# Patient Record
Sex: Female | Born: 1945 | Race: White | Hispanic: No | Marital: Single | Smoking: Never smoker
Health system: Southern US, Community
[De-identification: ages and names within clinical notes are randomized; demographics above are authoritative.]

## PROBLEM LIST (undated history)

## (undated) DIAGNOSIS — R110 Nausea: Secondary | ICD-10-CM

## (undated) DIAGNOSIS — I499 Cardiac arrhythmia, unspecified: Secondary | ICD-10-CM

## (undated) DIAGNOSIS — M8580 Other specified disorders of bone density and structure, unspecified site: Secondary | ICD-10-CM

## (undated) DIAGNOSIS — E785 Hyperlipidemia, unspecified: Secondary | ICD-10-CM

## (undated) DIAGNOSIS — I5189 Other ill-defined heart diseases: Secondary | ICD-10-CM

## (undated) DIAGNOSIS — K8020 Calculus of gallbladder without cholecystitis without obstruction: Secondary | ICD-10-CM

## (undated) DIAGNOSIS — M25569 Pain in unspecified knee: Secondary | ICD-10-CM

## (undated) DIAGNOSIS — G8929 Other chronic pain: Secondary | ICD-10-CM

## (undated) DIAGNOSIS — K5790 Diverticulosis of intestine, part unspecified, without perforation or abscess without bleeding: Secondary | ICD-10-CM

## (undated) DIAGNOSIS — K224 Dyskinesia of esophagus: Secondary | ICD-10-CM

## (undated) DIAGNOSIS — K602 Anal fissure, unspecified: Secondary | ICD-10-CM

## (undated) DIAGNOSIS — E039 Hypothyroidism, unspecified: Secondary | ICD-10-CM

## (undated) DIAGNOSIS — M810 Age-related osteoporosis without current pathological fracture: Secondary | ICD-10-CM

## (undated) DIAGNOSIS — R12 Heartburn: Secondary | ICD-10-CM

## (undated) DIAGNOSIS — M1990 Unspecified osteoarthritis, unspecified site: Secondary | ICD-10-CM

## (undated) DIAGNOSIS — H269 Unspecified cataract: Secondary | ICD-10-CM

## (undated) DIAGNOSIS — R002 Palpitations: Secondary | ICD-10-CM

## (undated) DIAGNOSIS — K635 Polyp of colon: Secondary | ICD-10-CM

## (undated) DIAGNOSIS — K219 Gastro-esophageal reflux disease without esophagitis: Secondary | ICD-10-CM

## (undated) HISTORY — DX: Other specified disorders of bone density and structure, unspecified site: M85.80

## (undated) HISTORY — DX: Diverticulosis of intestine, part unspecified, without perforation or abscess without bleeding: K57.90

## (undated) HISTORY — DX: Other chronic pain: G89.29

## (undated) HISTORY — DX: Hypothyroidism, unspecified: E03.9

## (undated) HISTORY — DX: Other ill-defined heart diseases: I51.89

## (undated) HISTORY — DX: Age-related osteoporosis without current pathological fracture: M81.0

## (undated) HISTORY — DX: Hyperlipidemia, unspecified: E78.5

## (undated) HISTORY — PX: HEEL SPUR SURGERY: SHX665

## (undated) HISTORY — PX: CHOLECYSTECTOMY: SHX55

## (undated) HISTORY — PX: ANAL FISSURE REPAIR: SHX2312

## (undated) HISTORY — DX: Gastro-esophageal reflux disease without esophagitis: K21.9

## (undated) HISTORY — DX: Anal fissure, unspecified: K60.2

## (undated) HISTORY — DX: Cardiac arrhythmia, unspecified: I49.9

## (undated) HISTORY — DX: Polyp of colon: K63.5

## (undated) HISTORY — DX: Nausea: R11.0

## (undated) HISTORY — DX: Unspecified cataract: H26.9

## (undated) HISTORY — DX: Unspecified osteoarthritis, unspecified site: M19.90

## (undated) HISTORY — DX: Pain in unspecified knee: M25.569

## (undated) HISTORY — DX: Heartburn: R12

## (undated) HISTORY — DX: Calculus of gallbladder without cholecystitis without obstruction: K80.20

## (undated) HISTORY — DX: Palpitations: R00.2

## (undated) HISTORY — PX: LIPOSUCTION: SHX10

## (undated) HISTORY — DX: Dyskinesia of esophagus: K22.4

## (undated) SURGERY — ESOPHAGOGASTRODUODENOSCOPY (EGD) WITH PROPOFOL
Anesthesia: Monitor Anesthesia Care

---

## 1969-01-03 HISTORY — PX: CHOLECYSTECTOMY: SHX55

## 1972-05-05 HISTORY — PX: THYROIDECTOMY: SHX17

## 1978-05-05 HISTORY — PX: ABDOMINAL HYSTERECTOMY: SHX81

## 2001-06-16 ENCOUNTER — Encounter: Payer: Self-pay | Admitting: Family Medicine

## 2001-06-16 ENCOUNTER — Encounter: Admission: RE | Admit: 2001-06-16 | Discharge: 2001-06-16 | Payer: Self-pay | Admitting: Family Medicine

## 2004-08-06 ENCOUNTER — Encounter: Admission: RE | Admit: 2004-08-06 | Discharge: 2004-08-06 | Payer: Self-pay | Admitting: General Surgery

## 2004-08-07 ENCOUNTER — Ambulatory Visit (HOSPITAL_BASED_OUTPATIENT_CLINIC_OR_DEPARTMENT_OTHER): Admission: RE | Admit: 2004-08-07 | Discharge: 2004-08-07 | Payer: Self-pay | Admitting: General Surgery

## 2004-08-07 ENCOUNTER — Ambulatory Visit (HOSPITAL_COMMUNITY): Admission: RE | Admit: 2004-08-07 | Discharge: 2004-08-07 | Payer: Self-pay | Admitting: General Surgery

## 2005-04-10 ENCOUNTER — Ambulatory Visit: Payer: Self-pay | Admitting: Internal Medicine

## 2005-05-21 ENCOUNTER — Encounter (INDEPENDENT_AMBULATORY_CARE_PROVIDER_SITE_OTHER): Payer: Self-pay | Admitting: *Deleted

## 2005-05-21 ENCOUNTER — Ambulatory Visit: Payer: Self-pay | Admitting: Internal Medicine

## 2006-02-11 ENCOUNTER — Ambulatory Visit: Payer: Self-pay | Admitting: Internal Medicine

## 2006-03-24 ENCOUNTER — Ambulatory Visit: Payer: Self-pay | Admitting: Internal Medicine

## 2006-06-10 ENCOUNTER — Ambulatory Visit: Payer: Self-pay | Admitting: Internal Medicine

## 2006-07-14 ENCOUNTER — Ambulatory Visit: Payer: Self-pay | Admitting: Internal Medicine

## 2007-03-22 ENCOUNTER — Encounter: Admission: RE | Admit: 2007-03-22 | Discharge: 2007-03-22 | Payer: Self-pay | Admitting: *Deleted

## 2007-06-01 ENCOUNTER — Ambulatory Visit: Payer: Self-pay | Admitting: Internal Medicine

## 2007-06-02 ENCOUNTER — Encounter: Payer: Self-pay | Admitting: Internal Medicine

## 2007-06-02 ENCOUNTER — Ambulatory Visit: Payer: Self-pay | Admitting: Internal Medicine

## 2007-06-07 ENCOUNTER — Ambulatory Visit: Payer: Self-pay | Admitting: Gastroenterology

## 2007-06-07 ENCOUNTER — Ambulatory Visit: Payer: Self-pay | Admitting: Internal Medicine

## 2007-06-07 LAB — CONVERTED CEMR LAB
Basophils Absolute: 0 10*3/uL (ref 0.0–0.1)
Basophils Relative: 0.2 % (ref 0.0–1.0)
Eosinophils Absolute: 0 10*3/uL (ref 0.0–0.6)
Eosinophils Relative: 0.5 % (ref 0.0–5.0)
HCT: 41 % (ref 36.0–46.0)
Hemoglobin: 13.5 g/dL (ref 12.0–15.0)
Lymphocytes Relative: 28.7 % (ref 12.0–46.0)
MCHC: 32.8 g/dL (ref 30.0–36.0)
MCV: 86.1 fL (ref 78.0–100.0)
Monocytes Absolute: 0.3 10*3/uL (ref 0.2–0.7)
Monocytes Relative: 7.9 % (ref 3.0–11.0)
Neutro Abs: 2.8 10*3/uL (ref 1.4–7.7)
Neutrophils Relative %: 62.7 % (ref 43.0–77.0)
Platelets: 273 10*3/uL (ref 150–400)
RBC: 4.77 M/uL (ref 3.87–5.11)
RDW: 12.2 % (ref 11.5–14.6)
WBC: 4.3 10*3/uL — ABNORMAL LOW (ref 4.5–10.5)

## 2009-03-15 DIAGNOSIS — K5730 Diverticulosis of large intestine without perforation or abscess without bleeding: Secondary | ICD-10-CM | POA: Insufficient documentation

## 2009-03-15 DIAGNOSIS — Z860100 Personal history of colon polyps, unspecified: Secondary | ICD-10-CM | POA: Insufficient documentation

## 2009-03-15 DIAGNOSIS — K219 Gastro-esophageal reflux disease without esophagitis: Secondary | ICD-10-CM | POA: Insufficient documentation

## 2009-03-15 DIAGNOSIS — K589 Irritable bowel syndrome without diarrhea: Secondary | ICD-10-CM | POA: Insufficient documentation

## 2009-03-15 DIAGNOSIS — R1310 Dysphagia, unspecified: Secondary | ICD-10-CM | POA: Insufficient documentation

## 2009-03-21 ENCOUNTER — Ambulatory Visit: Payer: Self-pay | Admitting: Internal Medicine

## 2009-03-23 ENCOUNTER — Ambulatory Visit (HOSPITAL_COMMUNITY): Admission: RE | Admit: 2009-03-23 | Discharge: 2009-03-23 | Payer: Self-pay | Admitting: Internal Medicine

## 2009-05-11 ENCOUNTER — Ambulatory Visit: Payer: Self-pay | Admitting: Internal Medicine

## 2010-05-15 ENCOUNTER — Encounter: Payer: Self-pay | Admitting: Internal Medicine

## 2010-06-04 NOTE — Assessment & Plan Note (Signed)
Summary: f.u ..em    History of Present Illness Visit Type: follow up Primary GI MD: Lina Sar MD Primary Provider: Herb Grays, MD Requesting Provider: n/a Chief Complaint: Follow up for dysphagia and diarrhea. Pt states that she is much better and denies any GI complaints  History of Present Illness:   This is a 65 year old white female with gastroesophageal reflux and secondary esophageal dysmotility. She is much improved on Prilosec 20 mg twice a day. Her dysphagia has resolved. A barium swallow done in November 2002  showed a disruption of the primary wave and contractions consistent with a nonspecific esophageal motility disorder. An upper endoscopy done in January 2009 for chest pain was normal; Her last colonoscopy in January 2007 showed mild diverticulosis and a hyperplastic polyp. Her recall colonoscopy will be due in January 2014. Medical history is positive for cholecystectomy in 1978, liposuction, thyroidectomy and history of anal fissure.   GI Review of Systems      Denies abdominal pain, acid reflux, belching, bloating, chest pain, dysphagia with liquids, dysphagia with solids, heartburn, loss of appetite, nausea, vomiting, vomiting blood, weight loss, and  weight gain.        Denies anal fissure, black tarry stools, change in bowel habit, constipation, diarrhea, diverticulosis, fecal incontinence, heme positive stool, hemorrhoids, irritable bowel syndrome, jaundice, light color stool, liver problems, rectal bleeding, and  rectal pain.    Current Medications (verified): 1)  Synthroid 75 Mcg Tabs (Levothyroxine Sodium) .... Once Daily 2)  Librax 2.5-5 Mg Caps (Clidinium-Chlordiazepoxide) .... As Needed 3)  Prilosec 20 Mg Cpdr (Omeprazole) .... Take 1 Tablet By Mouth Two Times A Day 30 Minutes Prior To A Meal.  Allergies (verified): No Known Drug Allergies  Past History:  Past Medical History: Reviewed history from 03/15/2009 and no changes required. Current Problems:   DYSPHAGIA UNSPECIFIED (ICD-787.20) COLONIC POLYPS, HYPERPLASTIC, HX OF (ICD-V12.72) IRRITABLE BOWEL SYNDROME (ICD-564.1) DIVERTICULOSIS, COLON (ICD-562.10) GERD (ICD-530.81)    Past Surgical History: Reviewed history from 03/15/2009 and no changes required. Cholecystectomy Hysterectomy Anal Fissure Repair Liposuction Thyroidectomy  Family History: Reviewed history from 03/15/2009 and no changes required. Family History of Colon Polyps: Family History of Breast Cancer: Sister No FH of Colon Cancer: Family History of Heart Disease: Father  Social History: Reviewed history from 03/21/2009 and no changes required. Occupation: CNA Alcohol Use - no Illicit Drug Use - no Patient has never smoked.  Daily Caffeine Use  Review of Systems  The patient denies allergy/sinus, anemia, anxiety-new, arthritis/joint pain, back pain, blood in urine, breast changes/lumps, change in vision, confusion, cough, coughing up blood, depression-new, fainting, fatigue, fever, headaches-new, hearing problems, heart murmur, heart rhythm changes, itching, menstrual pain, muscle pains/cramps, night sweats, nosebleeds, pregnancy symptoms, shortness of breath, skin rash, sleeping problems, sore throat, swelling of feet/legs, swollen lymph glands, thirst - excessive , urination - excessive , urination changes/pain, urine leakage, vision changes, and voice change.         Pertinent positive and negative review of systems were noted in the above HPI. All other ROS was otherwise negative.   Vital Signs:  Patient profile:   65 year old female Height:      63 inches Weight:      152 pounds BMI:     27.02 BSA:     1.72 Pulse rate:   76 / minute Pulse rhythm:   regular BP sitting:   104 / 72  (left arm) Cuff size:   regular  Vitals Entered By: Ok Anis CMA (May 11, 2009 1:58 PM)   Impression & Recommendations:  Problem # 1:  DYSPHAGIA UNSPECIFIED (ICD-787.20) Patient has had complete resolution  of dysphagia secondary to esophageal dysmotility. The symptoms improved on an increased dose of a proton pump inhibitor; Prilosec 20 mg twice a day. She now has only occasional episodes of dysphagia. She will continue on the current regimen.  Problem # 2:  IRRITABLE BOWEL SYNDROME (ICD-564.1) Patient has IBS with predominant diarrhea under good control with Librax p.r.n. Her last colonoscopy was completed in 2007. A recall colonoscopy will be due in January 2014.  Problem # 3:  COLONIC POLYPS, HYPERPLASTIC, HX OF (ICD-V12.72) Patient had a hyperplastic polyp found on a colonoscopy done in January 2007. Her recall colonoscopy will be due in January 2014.  Patient Instructions: 1)  Prilosec 20 mg p.o. b.i.d. 2)  Antireflux measures. 3)  Librax one p.o. p.r.n. irritable bowel syndrome/diarrhea. 4)  Recall colonoscopy January 2014. 5)  Copy sent to : Dr Collins Scotland

## 2010-06-06 NOTE — Letter (Signed)
Summary: Colonoscopy Date Change Letter  [facility]  [address] [place], [place] [address]   Phone: [phone]  Fax: [phone]      May 15, 2010 MRN: [id number]   Gloria Atkins [address] [place], [place]  [address]   Dear Ms. Batten,   Previously you were recommended to have a repeat colonoscopy around this time. Your chart was recently reviewed by Dr. Lina Sar of [facility]. Follow up colonoscopy is now recommended in January 2014. This revised recommendation is based on current, nationally recognized guidelines for colorectal cancer screening and polyp surveillance. These guidelines are endorsed by the American Cancer Society, [employer] on Colorectal Cancer as well as numerous other major medical organizations.  Please understand that our recommendation assumes that you do not have any new symptoms such as bleeding, a change in bowel habits, anemia, or significant abdominal discomfort. If you do have any concerning GI symptoms or want to discuss the guideline recommendations, please call to arrange an office visit at your earliest convenience. Otherwise we will keep you in our reminder system and contact you 1-2 months prior to the date listed above to schedule your next colonoscopy.  Thank you,  Hedwig Morton. Juanda Chance, M.D.  [facility] [phone]

## 2010-08-07 ENCOUNTER — Encounter: Payer: Self-pay | Admitting: Cardiology

## 2010-08-07 DIAGNOSIS — G8929 Other chronic pain: Secondary | ICD-10-CM | POA: Insufficient documentation

## 2010-08-07 DIAGNOSIS — I5189 Other ill-defined heart diseases: Secondary | ICD-10-CM | POA: Insufficient documentation

## 2010-08-07 DIAGNOSIS — R0602 Shortness of breath: Secondary | ICD-10-CM | POA: Insufficient documentation

## 2010-08-07 DIAGNOSIS — M8580 Other specified disorders of bone density and structure, unspecified site: Secondary | ICD-10-CM | POA: Insufficient documentation

## 2010-08-08 ENCOUNTER — Ambulatory Visit (INDEPENDENT_AMBULATORY_CARE_PROVIDER_SITE_OTHER): Payer: 59 | Admitting: Cardiology

## 2010-08-08 ENCOUNTER — Encounter: Payer: Self-pay | Admitting: Cardiology

## 2010-08-08 VITALS — BP 132/78 | HR 69 | Ht 64.0 in | Wt 150.6 lb

## 2010-08-08 DIAGNOSIS — R002 Palpitations: Secondary | ICD-10-CM

## 2010-08-08 DIAGNOSIS — R079 Chest pain, unspecified: Secondary | ICD-10-CM | POA: Insufficient documentation

## 2010-08-08 NOTE — Assessment & Plan Note (Signed)
Need to rule out primary arrhythmia. We will have the patient wear an event monitor and followup again in one month. I have recommended that she avoid caffeine.

## 2010-08-08 NOTE — Progress Notes (Signed)
HPI Gloria Atkins is seen today at the request of Dr. Collins Scotland for evaluation of palpitations.she also has a history of chest pain. She states she has been having palpitations off and on for the past several months. She describes this as her heart beating fast. It occurs mostly at night. It may occur up to 3 days a week. It typically lasts a few minutes and then resolves. She has no associated dizziness or syncope. She also describes  substernal chest pain that she feels is more related to acid reflux. She states that it is central pressure-like sensation. She did have a stress echo here in 2010 which was normal. She had GI evaluation was told that she had acid reflux. She has not been taking any regular medication for this. No Known Allergies  Current Outpatient Prescriptions on File Prior to Visit  Medication Sig Dispense Refill  . aspirin EC 81 MG EC tablet Take 81 mg by mouth daily.        . Calcium Citrate-Vitamin D (CITRACAL + D PO) Take by mouth 2 (two) times daily.        . Cholecalciferol (VITAMIN D PO) Take by mouth daily.        Marland Kitchen HYDROcodone-ibuprofen (VICOPROFEN) 7.5-200 MG per tablet Take 1 tablet by mouth every 8 (eight) hours as needed.        . hydrOXYzine (ATARAX) 25 MG tablet Take 25 mg by mouth as needed.        Marland Kitchen levothyroxine (SYNTHROID, LEVOTHROID) 88 MCG tablet Take 88 mcg by mouth daily.        . Magnesium 250 MG TABS Take by mouth daily as needed.        Marland Kitchen omeprazole (PRILOSEC) 20 MG capsule Take 20 mg by mouth 2 (two) times daily.          Past Medical History  Diagnosis Date  . Osteopenia   . Chronic knee pain   . Arrhythmia     SOMETIMES HER HEART WILL BEAT REALLY FAST FOR NO REASON  . SOB (shortness of breath)   . Heartburn   . Nausea   . Diastolic dysfunction   . GERD (gastroesophageal reflux disease)     Past Surgical History  Procedure Date  . Gallbladder surgery   . Colonoscopy   . Thyroidectomy   . Heel spur surgery   . Abdominal hysterectomy      Family History  Problem Relation Age of Onset  . Coronary artery disease Father   . Heart attack Father   . Hypertension    . Breast cancer Sister   . Diabetes      History   Social History  . Marital Status: Divorced    Spouse Name: N/A    Number of Children: 2  . Years of Education: N/A   Occupational History  . CNA     [employer]   Social History Main Topics  . Smoking status: Never Smoker   . Smokeless tobacco: Not on file  . Alcohol Use: No  . Drug Use: No  . Sexually Active:    Other Topics Concern  . Not on file   Social History Narrative  . No narrative on file    ROS The patient denies any heat or cold intolerance.  No weight gain or weight loss.  The patient denies headaches or blurry vision.  There is no cough or sputum production.  The patient denies dizziness.  There is no hematuria or hematochezia.  The  patient denies any muscle aches or arthritis.  The patient denies any rash.  The patient denies frequent falling or instability.  There is no history of depression or anxiety.  All other systems were reviewed and are negative.  PHYSICAL EXAM BP 132/78  Pulse 69  Ht 5\' 4"  (1.626 m)  Wt 150 lb 9.6 oz (68.312 kg)  BMI 25.85 kg/m2 The patient is alert and oriented x 3.  The mood and affect are normal.  The skin is warm and dry.  Color is normal.  The HEENT exam reveals that the sclera are nonicteric.  The mucous membranes are moist.  The carotids are 2+ without bruits.  There is no thyromegaly.  There is no JVD.  The lungs are clear.  The chest wall is non tender.  The heart exam reveals a regular rate with a normal S1 and S2.  There are no murmurs, gallops, or rubs.  The PMI is not displaced.   Abdominal exam reveals good bowel sounds.  There is no guarding or rebound.  There is no hepatosplenomegaly or tenderness.  There are no masses.  Exam of the legs reveal no clubbing, cyanosis, or edema.  The legs are without rashes.  The distal pulses are intact.   Cranial nerves II - XII are intact.  Motor and sensory functions are intact.  The gait is normal.  Laboratory data: ECG today demonstrates normal sinus rhythm with poor R-wave progression in the anterior precordium. This is unchanged compared to prior ECG in June of 2010. ASSESSMENT AND PLAN

## 2010-08-08 NOTE — Patient Instructions (Signed)
Take Omeprazole 20 mg daily for acid reflux.  We will have you wear an event monitor to look at your heart rhythm..  You should reduce your caffeine intake.  We will see you back in one month for office visit.

## 2010-08-08 NOTE — Assessment & Plan Note (Signed)
Patient symptoms are atypical. I recommended a trial of omeprazole 20 mg daily. If her symptoms resolve then we probably do not need to pursue any further evaluation. If she continues to have chest discomfort we can reschedule her for a stress echo and compare with 2010.

## 2010-09-04 ENCOUNTER — Encounter: Payer: Self-pay | Admitting: Cardiology

## 2010-09-10 ENCOUNTER — Ambulatory Visit: Payer: 59 | Admitting: Nurse Practitioner

## 2010-09-10 ENCOUNTER — Encounter: Payer: Self-pay | Admitting: Nurse Practitioner

## 2010-09-10 ENCOUNTER — Ambulatory Visit (INDEPENDENT_AMBULATORY_CARE_PROVIDER_SITE_OTHER): Payer: 59 | Admitting: Nurse Practitioner

## 2010-09-10 ENCOUNTER — Telehealth: Payer: Self-pay | Admitting: *Deleted

## 2010-09-10 VITALS — BP 120/70 | HR 76 | Wt 152.0 lb

## 2010-09-10 DIAGNOSIS — R002 Palpitations: Secondary | ICD-10-CM

## 2010-09-10 NOTE — Patient Instructions (Signed)
Continue to limit your caffeine. We will see you back as needed.

## 2010-09-10 NOTE — Progress Notes (Signed)
   Gloria Atkins Date of Birth: 1945-06-11   History of Present Illness: Gloria Atkins is seen back today for a one month visit. She is seen for Dr. [place]. She had palpitations. Her event monitor was normal. No arrhythmia was noted. She had a negative stress echo in 2010. Since she was last here, she has stopped almost all of her caffeine. Her palpitations have now stopped.   Current Outpatient Prescriptions on File Prior to Visit  Medication Sig Dispense Refill  . aspirin EC 81 MG EC tablet Take 81 mg by mouth daily.        . Calcium Citrate-Vitamin D (CITRACAL + D PO) Take by mouth 2 (two) times daily.        . Cholecalciferol (VITAMIN D PO) Take by mouth daily.        Marland Kitchen HYDROcodone-ibuprofen (VICOPROFEN) 7.5-200 MG per tablet Take 1 tablet by mouth every 8 (eight) hours as needed.        Marland Kitchen levothyroxine (SYNTHROID, LEVOTHROID) 88 MCG tablet Take 88 mcg by mouth daily.        . hydrOXYzine (ATARAX) 25 MG tablet Take 25 mg by mouth as needed.        . Magnesium 250 MG TABS Take by mouth daily as needed.        Marland Kitchen omeprazole (PRILOSEC) 20 MG capsule Take 20 mg by mouth 2 (two) times daily.          No Known Allergies  Past Medical History  Diagnosis Date  . Osteopenia   . Chronic knee pain   . Arrhythmia     SOMETIMES HER HEART WILL BEAT REALLY FAST FOR NO REASON  . SOB (shortness of breath)   . Heartburn   . Nausea   . Diastolic dysfunction   . GERD (gastroesophageal reflux disease)     Past Surgical History  Procedure Date  . Gallbladder surgery   . Colonoscopy   . Thyroidectomy   . Heel spur surgery   . Abdominal hysterectomy     History  Smoking status  . Never Smoker   Smokeless tobacco  . Not on file    History  Alcohol Use No    Family History  Problem Relation Age of Onset  . Coronary artery disease Father   . Heart attack Father   . Hypertension    . Breast cancer Sister   . Diabetes      Review of Systems: The review of systems is as above.  All  other systems were reviewed and are negative.  Physical Exam: BP 120/70  Pulse 76  Wt 152 lb (68.947 kg) Patient is very pleasant and in no acute distress. Skin is warm and dry. Color is normal.  HEENT is unremarkable. Normocephalic/atraumatic. PERRL. Sclera are nonicteric. Neck is supple. No masses. No JVD. Lungs are clear. Cardiac exam shows a regular rate and rhythm. Abdomen is soft. Extremities are without edema. Gait and ROM are intact. No gross neurologic deficits noted.  LABORATORY DATA:  Event monitor was negative.    Assessment / Plan:

## 2010-09-10 NOTE — Assessment & Plan Note (Signed)
Her event monitor was normal. No arrhythmia. Caffeine is her trigger. She is doing better with cessation of caffeine. We will see her back as needed and will otherwise defer her management to Dr. Collins Scotland. Patient is agreeable to this plan and will call if any problems develop in the interim.

## 2010-09-10 NOTE — Telephone Encounter (Signed)
LM of final report on event monitor; faxed copy to Dr. Collins Scotland

## 2010-09-11 ENCOUNTER — Encounter: Payer: Self-pay | Admitting: Cardiology

## 2010-09-17 NOTE — Assessment & Plan Note (Signed)
Gloria Atkins                         GASTROENTEROLOGY OFFICE NOTE   Gloria Atkins, Gloria Atkins                      MRN:          [id number]  DATE:06/01/2007                            DOB:          1945/06/03    Gloria Atkins is a very nice 65 year old white female who is here today  for evaluation of her chest pain.  She already had actual cardiology  evaluation by Dr. Reyes Ivan including CT angiogram on March 22, 2007,  which by the way showed a small hiatal hernia.  The patient has had a  lot of burning substernal chest pain.  She has regurgitation of food at  night.  She has some difficulty swallowing, and even some dysphagia.  We  have seen Gloria Atkins in the past for colorectal screening and for  symptoms related to diarrhea and family history of colon polyps.  Her  colonoscopy in January of 2007 showed diverticulosis of the left colon  and diminutive polyp of the sigmoid colon which showed hyperplastic  polyp.   MEDICATIONS:  1. Synthroid 100 mcg daily.  2. Omeprazole 20 mg one p.o. b.i.d.  3. Aspirin 81 mg p.o. daily.  4. Metamucil.  5. Prograf.   PERSONAL HISTORY:  1. Anal fissure in 2005.  2. She had a thyroidectomy in the past.  3. Liposuction.  4. The patient underwent open cholecystectomy in 1978.   PHYSICAL EXAMINATION:  VITAL SIGNS:  Blood pressure 106/70, pulse 60 and  weight 158 pounds.  GENERAL:  She was alert, oriented and in no distress.  Her voice was  normal.  There was no cough.  NECK:  Supple.  No lymphadenopathy.  LUNGS:  Clear to auscultation.  There were no wheezes or rales.  COR:  Quiet precordium.  Normal S1, normal S2.  ABDOMEN:  Soft, mildly tender in the subxiphoid and epigastric area.  Lower abdomen was normal except for mild discomfort in the left lower  quadrant.  RECTAL:  Not done.  EXTREMITIES:  No edema.   IMPRESSION:  A 65 year old white female with intermittent chest pain and  negative cardac work-up  with symptoms consistent with gastroesophageal  reflux and regurgitation.  She has been refractory to proton pump  inhibitors which raises the question of possibly bile or alkaline  reflux.  This is more often seen in patients after a cholecystectomy.  It also raises the possibility of delayed gastric emptying, which may  lead to increased gastroesophageal reflux.  We need to rule out  Barrett's esophagus, hiatal hernia as suggested by CT scan of the chest.   PLAN:  1. Switch to Aciphex 20 mg p.o. b.i.d. which is preferred by Gloria Atkins.  2. Anti-reflux measures.  3. Consider adding Carafate slurry, depending on the results of the      upper endoscopy she has been scheduled for this week.  We will      obtain appropriate biopsies at that time.  We will also consider      Reglan as part of her regimen, depending on her response to  Aciphex.     Gloria Morton. Juanda Chance, MD  Electronically Signed    DMB/MedQ  DD: 06/01/2007  DT: 06/02/2007  Job #: [id number]   cc:   Tammy R. Collins Scotland, M.D.  Elmore Guise., M.D.

## 2010-09-17 NOTE — Assessment & Plan Note (Signed)
[facility]                         GASTROENTEROLOGY OFFICE NOTE   Gloria Atkins, Gloria Atkins                      MRN:          [id number]  DATE:06/07/2007                            DOB:          1945/05/14    PROBLEM:  Black diarrhea x3 days.   HISTORY:  Gloria Atkins is a pleasant, 65 year old, white female known to Dr.  Lina Sar who was just seen in the office last week on June 01, 2007. At that time, she was seen for chest pain which was felt  consistent with GERD and regurgitation. She underwent upper endoscopy on  June 02, 2007 which was a normal exam. She was placed on a trial of  metoclopramide 10 mg at bedtime and AcipHex 20 mg b.i.d. The patient is  status post remote cholecystectomy. She has had a prior colonoscopy done  in January 2007 which did show small colon polyps and diverticulosis.   The patient says that she had onset on Saturday, January 31, with watery  diarrhea which was very dark to black in color. She said she had several  episodes on Saturday, actually had 1 episode of severe urgency and  incontinence. She was also nauseated and did have some intermittent  vomiting, did not notice any coffee-ground emesis or heme in her emesis.  She has not had any documented fever so she felt like she has had a low  grade fever and had some headache today. The patient did take some Pepto  Bismol last evening but said she had not taken any Pepto Bismol prior to  that time. Today she is feeling a little bit better, she says she still  feel queasy but has not had any vomiting. She has been keeping down some  toast, Gatorade, etc. She held her AcipHex yesterday as well as the  Reglan because she said she could not sleep with the Reglan.   CURRENT MEDICATIONS:  1. Synthroid 100 mcg daily.  2. AcipHex 20 mg b.i.d.  3. Aspirin 81 mg daily.   ALLERGIES:  No known drug allergies.   CBC done today shows a hemoglobin of 13.5.   PHYSICAL  EXAMINATION:  Well-developed, white female in no acute  distress. She is somewhat pale.  Weight is 155, blood pressure 96/68, pulse in the 80s.  CARDIOVASCULAR:  Regular rate and rhythm with S1 and S2. No murmur or  gallop.  PULMONARY:  Clear to A&P.  ABDOMEN:  Soft and minimally tender in the left lower quadrant. There is  no guarding or rebound, no mass or a palpable hepatosplenomegaly. Bowel  sounds are active.  RECTAL:  Reveals dark brown stool which is Hemoccult negative. No mass.   IMPRESSION:  62. A 65 year old female with a 3-day history of diarrhea, nausea and      vomiting. I suspect this is a gastroenteritis. Also wonder if      intolerance to Reglan contributing to her diarrhea.  2. Black stool currently Hemoccult negative and normal hemoglobin and      therefore no evidence of underlying GI bleeding.  3. Gastroesophageal reflux disease.   PLAN:  1.  Trial of Phenergan 12.5 to 25 mg q.6 h p.r.n., nausea and vomiting,      Imodium t.i.d. to q.i.d. as needed. Advance to soft bland diet as      tolerated.  2. Discontinue Reglan.  3. Continue AcipHex 20 mg b.i.d.  4. Offered the patient an excuse for work for the next 48 hours but      she is scheduled to work today and says she would like to try. She      will call us back if her symptoms have not significantly improved      by the end of the week.      Mike Gip, PA-C  Electronically Signed      Barbette Hair. Arlyce Dice, MD,FACG  Electronically Signed   AE/MedQ  DD: 06/07/2007  DT: 06/08/2007  Job #: [id number]   cc:   Hedwig Morton. Juanda Chance, MD

## 2010-09-20 NOTE — Assessment & Plan Note (Signed)
[facility]                         GASTROENTEROLOGY OFFICE NOTE   ADAORA, MCHANEY                      MRN:          [id number]  DATE:06/10/2006                            DOB:          22-Sep-1945    Gloria Atkins is a 65 year old white female with history of hyperplastic  polyp of the sigmoid colon on colonoscopy in January 2007 and left-sided  diverticulosis.  She has a positive family history of colon polyps.  She  had a repair of anal fissure several years ago.  She now comes with  several episodes of rectal bleeding, a rather large amount of bleeding  at the end of December 2007 and continued small amount of intermittent  bright red blood per rectum several times a week.  There is also some  left lower quadrant abdominal discomfort,  incontinence to stool and  flatus.   MEDICATIONS:  1. Synthroid 75 micrograms daily.  2. Librax p.r.n., usually every other day.   PHYSICAL EXAMINATION:  Blood pressure 126/74, pulse 80 and weight 152  pounds.  She was alert, oriented, in no distress.  LUNGS:  Clear to auscultation.  COR:  With normal S1, normal S2.  ABDOMEN:  Soft, very tender in the left lower quadrant, left middle  quadrants.  No distention, normoactive bowel sounds.  RECTAL AND ANOSCOPIC EXAM:  Shows external hemorrhoidal tags, decreased  rectal tone and two grade 1 active hemorrhoids in the rectal ampulla,  both of them with erythema and some hyperemia, suggesting there might  have been bleeding.  Stool is Hemoccult-negative.   IMPRESSION:  1. The patient is a 65 year old white female with intermittent rectal      bleeding, likely due to hemorrhoids, which are first grade and are      internal  2. Status post remote repair of anal fissure.  3. Somewhat decreased rectal sphincter tone, causing some incontinence      to gas and stool.  4. Irritable bowel syndrome and diverticulosis, causing left lower      quadrant discomfort.   PLAN:  1. Continue Librax on daily basis, not prn  2. High-fiber diet.  3. Booklet on gas.  4. Anusol-HC suppositories q.h.s. with one refill.  If not improved in      four weeks on re-examination, consider banding of the hemorrhoids.     Hedwig Morton. Juanda Chance, MD  Electronically Signed    DMB/MedQ  DD: 06/10/2006  DT: 06/10/2006  Job #: [id number]   cc:   Tammy R. Collins Scotland, M.D.

## 2010-09-20 NOTE — Assessment & Plan Note (Signed)
Gloria Atkins                           GASTROENTEROLOGY OFFICE NOTE   Gloria Atkins, Gloria Atkins                      MRN:          [id number]  DATE:02/11/2006                            DOB:          July 08, 1945    Gloria Atkins is a very nice 65 year old white female with irritable bowel  syndrome, slight diarrhea.  She had a hyperplastic polyp on the colonoscopy  in January 2007.  She also had a cholecystectomy in 1978, total abdominal  hysterectomy, liposuction and thyroidectomy.  She is on thyroid supplements  of 75 mcg a day.  She has been complaining of diarrhea, urgency and  incontinence.  This occurs after meals but never at night.  There is no  rectal bleeding.  Bowel movement usually relieves the abdominal discomfort.  She also has a lot of gas.  We have tried her on Bentyl 20 mg as well as  Xanax, with resulting constipation.  She has not taken the medicine because  of the constipating effect.  She is an acute work-in today because of the  diarrhea.   PHYSICAL EXAMINATION:  Blood pressure 110/70, pulse 68, weight 147 pounds.  She was in no distress.  LUNGS:  Clear to auscultation.  Cor with normal S1, normal S2.  ABDOMEN:  Soft with normal active bowel sounds, minimal tenderness in the  left lower quadrant, normal upper abdomen and right lower quadrant.  RECTAL:  Exam not done.   IMPRESSION:  A 65 year old white female with recent normal colonoscopy  except for hyperplastic polyp, with ongoing diarrhea which has a pattern of  irritable bowel syndrome.   PLAN:  1. High fiber diet.  2. Levsin sublingually 0.125 mg before meals.  3. TSH level today to rule out hypothyroidism.  She is due to see Dr.      Collins Scotland for followup of her thyroid condition.       Gloria Atkins. Juanda Chance, MD      DMB/MedQ  DD:  02/11/2006  DT:  02/13/2006  Job #:  [id number]   cc:   Tammy R. Collins Scotland, M.D.

## 2010-09-20 NOTE — Assessment & Plan Note (Signed)
[facility]                           GASTROENTEROLOGY OFFICE NOTE   Atkins, Gloria                      MRN:          [id number]  DATE:03/24/2006                            DOB:          14-Dec-1945    Ms. Kehm is a very nice 65 year old white female who has irritable bowel  syndrome.  She was found to have a hyperplastic polyp on colonoscopy in  January 2007.  Her next colonoscopy would be in January 2012.  She was put  on Librax for her irritable bowel syndrome after being on Bentyl 20 mg twice  a day.  She likes Librax better than Bentyl.  Her sister has been on Librax.  The patient takes it only one capsule every-other day.  She is afraid that  it is habit forming.  She also has occasional bright red blood per rectum  when she wipes.   MEDICATIONS:  Synthroid 0.075 mg daily.   PHYSICAL EXAMINATION:  VITAL SIGNS:  Blood pressure 94/70, pulse 72, weight  149 pounds.  GENERAL:  She was alert, oriented, in no distress.  LUNGS:  Clear to auscultation.  CARDIAC:  Normal S1, S2.  ABDOMEN:  Soft, relaxed, with decreased bowel sounds, decreased muscle tone.  Minimal tenderness in left lower quadrant.  RECTAL:  Shows external hemorrhoidal tags.  Stool is soft, hemoccult  negative.  Rectal tone is somewhat decreased.   IMPRESSION:  A 65 year old white female with irritable bowel  syndrome/diarrhea, rectal bleeding due to external hemorrhoidal tags.   PLAN:  1. Increase Librax to one every day or b.i.d.  2. Samples of AnaMantle 2.5% given to use on a p.r.n. basis for rectal      bleeding.  3. Advised to get over-the-counter Phazyme for gas and flatulence.  4. We will repeat colonoscopy in 2012.     Hedwig Morton. Juanda Chance, MD  Electronically Signed    DMB/MedQ  DD: 03/24/2006  DT: 03/24/2006  Job #: [id number]   cc:   Tammy R. Collins Scotland, M.D.

## 2010-09-20 NOTE — Op Note (Signed)
NAMEAHSLEY, ATTWOOD               ACCOUNT NO.:  [id number]   MEDICAL RECORD NO.:  [id number]          PATIENT TYPE:  AMB   LOCATION:  [facility]                         FACILITY:  [facility]   PHYSICIAN:  Sharlet Salina T. Hoxworth, M.D.DATE OF BIRTH:  Aug 25, 1945   DATE OF PROCEDURE:  08/07/2004  DATE OF DISCHARGE:                                 OPERATIVE REPORT   PREOPERATIVE DIAGNOSIS:  Chronic anal fissure.   POSTOPERATIVE DIAGNOSIS:  Chronic anal fissure.   SURGICAL PROCEDURE:  Lateral and internal anal sphincterotomy.   SURGEON:  Dr. Johna Sheriff.   ANESTHESIA:  General.   BRIEF HISTORY:  Gloria Atkins is a 65 year old female who presents with  three months of persistent anal pain, particularly with bowel movements.  Examination has revealed a posterior midline fissure. Options for treatment  have been discussed, and we have elected to proceed with lateral internal  anal sphincterotomy. Nature of procedure, indications and risks of bleeding,  infection, degrees of incontinence, and failure of healing have been  discussed and understood. The patient is now brought to the operating room  for this procedure.   DESCRIPTION OF OPERATION:  The patient was brought to the operating room,  placed in supine position on the operating table, and general endotracheal  anesthesia was induced. She was then carefully positioned in lithotomy, and  the perineum sterilely prepped and draped. Noted on external exam were some  moderate noninflamed external hemorrhoids. The bullet retractor was placed  and a posterior midline fissure was confirmed. The internal anal sphincter  felt taut and hypertrophied. The intersphincteric groove was identified in  the left lateral perianal space. This was anesthetized with 0.5% Marcaine  with epinephrine. A small stab incision was made here, and a hemostat tip  inserted into the intersphincteric groove and the hypertrophied internal  anal sphincter encircled and brought up  into the small incision. This was  divided with cautery. Hemostasis was obtained with pressure. A gauze  dressing was applied. The patient taken to recovery in good condition.      BTH/MEDQ  D:  08/07/2004  T:  08/07/2004  Job:  [id number]

## 2010-09-20 NOTE — Assessment & Plan Note (Signed)
[facility]                         GASTROENTEROLOGY OFFICE NOTE   FADUMA, CHO                      MRN:          [id number]  DATE:07/14/2006                            DOB:          1945-07-24    Miss Aydelotte is a very nice 65 year old patient of doctor Spear's with  irritable bowel syndrome/diarrhea, since her last appointment on  June 10, 2006 her rectal irritation has improved tremendously and the  left lower quadrant abdominal pain has also improved. She still has some  residual discomfort in left lower quadrant. She is not taking her Librax  regularly. Her last colonoscopy in January 2007, showed mild  diverticulosis of the left colon and hyperplastic polyp of the colon.  She has a family history of colon polyps.  Patient had a previous  cholecystectomy.   PHYSICAL EXAMINATION:  Blood pressure 126/78, pulse 64, and weight 153  pounds.  LUNGS: Clear to auscultation.  COR: Normal S1, S2.  ABDOMEN: Soft, with mild tenderness in left lower quadrant and diffusely  throughout the abdomen with normoactive bowel sounds, no distension.  RECTAL EXAM: Not repeated.   IMPRESSION:  A 65 year old white female with irritable bowel  syndrome/diarrhea/left lower quadrant discomfort, improved on  antispasmodics and dietary modifications.   PLAN:  1. Continue Librax up to 3 times a day, refills electronically      prescribed to [facility] in [place]  2. Continue high fiber diet.  3. Return on p.r.n. basis.     Hedwig Morton. Juanda Chance, MD  Electronically Signed    DMB/MedQ  DD: 07/14/2006  DT: 07/14/2006  Job #: [id number]   cc:   Tammy R. Collins Scotland, M.D.

## 2011-07-02 ENCOUNTER — Ambulatory Visit: Payer: 59 | Admitting: Cardiology

## 2011-07-25 ENCOUNTER — Ambulatory Visit (INDEPENDENT_AMBULATORY_CARE_PROVIDER_SITE_OTHER): Payer: 59 | Admitting: Cardiology

## 2011-07-25 ENCOUNTER — Encounter: Payer: Self-pay | Admitting: Cardiology

## 2011-07-25 VITALS — BP 110/70 | HR 80 | Ht 63.0 in | Wt 150.0 lb

## 2011-07-25 DIAGNOSIS — R002 Palpitations: Secondary | ICD-10-CM

## 2011-07-25 DIAGNOSIS — I4949 Other premature depolarization: Secondary | ICD-10-CM

## 2011-07-25 DIAGNOSIS — I493 Ventricular premature depolarization: Secondary | ICD-10-CM

## 2011-07-25 NOTE — Patient Instructions (Signed)
Try and reduce your caffeine intake.  Get regular exercise and eat a heart healthy diet.

## 2011-07-25 NOTE — Assessment & Plan Note (Signed)
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 Prior event monitor and stress echo were normal. I've explained that these PVCs are benign and do not carry any additional risk. They're asymptomatic. I would therefore not recommend any additional treatment. I would recommend avoidance of stimulants such as tobacco, caffeine, and pseudoephedrine. I would be happy to see her back as needed if she becomes more symptomatic.

## 2011-07-25 NOTE — Progress Notes (Signed)
Gloria Atkins Date of Birth: [birth year]   History of Present Illness: Gloria Atkins is seen back today for a followup visit. She reports that she was seen for a routine physical last month and it was noted that her heartbeat was irregular. For some time she has experienced symptoms of her heart running away when she lies down at night. She previously was referred by Dr. Collins Atkins for evaluation but is now being seen by [facility]. In the past she was evaluated with an event monitor for the symptoms and it was benign. She had a normal stress echo evaluation in June of 2010. She denies any chest pain or shortness of breath.  Current Outpatient Prescriptions on File Prior to Visit  Medication Sig Dispense Refill  . aspirin EC 81 MG EC tablet Take 81 mg by mouth daily.        . Calcium Citrate-Vitamin D (CITRACAL + D PO) Take by mouth 2 (two) times daily.        . Cholecalciferol (VITAMIN D PO) Take by mouth daily.        Marland Kitchen HYDROcodone-ibuprofen (VICOPROFEN) 7.5-200 MG per tablet Take 1 tablet by mouth every 8 (eight) hours as needed.        . hydrOXYzine (ATARAX) 25 MG tablet Take 25 mg by mouth as needed.        Marland Kitchen levothyroxine (SYNTHROID, LEVOTHROID) 88 MCG tablet Take 88 mcg by mouth daily.        . Magnesium 250 MG TABS Take by mouth daily as needed.        Marland Kitchen omeprazole (PRILOSEC) 20 MG capsule Take 20 mg by mouth 2 (two) times daily.          No Known Allergies  Past Medical History  Diagnosis Date  . Osteopenia   . Chronic knee pain   . Arrhythmia     SOMETIMES HER HEART WILL BEAT REALLY FAST FOR NO REASON  . SOB (shortness of breath)   . Heartburn   . Nausea   . Diastolic dysfunction   . GERD (gastroesophageal reflux disease)   . Heart palpitations     Negative event monitor 4/12  . Asthma     Past Surgical History  Procedure Date  . Gallbladder surgery   . Colonoscopy   . Thyroidectomy   . Heel spur surgery   . Abdominal hysterectomy     History  Smoking  status  . Never Smoker   Smokeless tobacco  . Never Used    History  Alcohol Use No    Family History  Problem Relation Age of Onset  . Coronary artery disease Father   . Heart attack Father   . Hypertension    . Breast cancer Sister   . Diabetes      Review of Systems: The review of systems is as above.  All other systems were reviewed and are negative.  Physical Exam: BP 110/70  Pulse 80  Ht 5\' 3"  (1.6 m)  Wt 150 lb (68.04 kg)  BMI 26.57 kg/m2 Patient is very pleasant and in no acute distress. Skin is warm and dry. Color is normal.  HEENT is unremarkable. Normocephalic/atraumatic. PERRL. Sclera are nonicteric. Neck is supple. No masses. No JVD. Lungs are clear. Cardiac exam shows a regular rate and rhythm with occasional extrasystole. Abdomen is soft. Extremities are without edema. Gait and ROM are intact. No gross neurologic deficits noted.  LABORATORY DATA:   ECG today demonstrates normal sinus rhythm with  unifocal PVCs in a quadrigeminy pattern. She has low voltage and poor R-wave progression.   Assessment / Plan:

## 2011-07-30 ENCOUNTER — Encounter: Payer: Self-pay | Admitting: *Deleted

## 2011-08-05 ENCOUNTER — Ambulatory Visit: Payer: 59 | Admitting: Internal Medicine

## 2011-08-12 ENCOUNTER — Encounter: Payer: Self-pay | Admitting: Internal Medicine

## 2011-08-12 ENCOUNTER — Ambulatory Visit (INDEPENDENT_AMBULATORY_CARE_PROVIDER_SITE_OTHER): Payer: 59 | Admitting: Internal Medicine

## 2011-08-12 VITALS — BP 120/84 | HR 70 | Ht 64.0 in | Wt 150.6 lb

## 2011-08-12 DIAGNOSIS — R1319 Other dysphagia: Secondary | ICD-10-CM

## 2011-08-12 DIAGNOSIS — Z86010 Personal history of colonic polyps: Secondary | ICD-10-CM

## 2011-08-12 MED ORDER — OMEPRAZOLE 20 MG PO CPDR: 20.0000 mg | DELAYED_RELEASE_CAPSULE | Freq: Every day | ORAL | Status: DC

## 2011-08-12 MED ORDER — PEG-KCL-NACL-NASULF-NA ASC-C 100 G PO SOLR: 1.0000 | Freq: Once | ORAL | Status: DC

## 2011-08-12 NOTE — Progress Notes (Signed)
Gloria Atkins 30-Jul-1945 MRN [id number]   History of Present Illness:  This is a 66 year old white female with a history of gastroesophageal reflux and esophageal dysmotility. She has noticed progressive solid food dysphagia. Her last upper endoscopy in January 2009 for chest pain was essentially normal. Biopsies showed no evidence of Barrett's esophagus. She was treated with AcipHex and Reglan. Her symptoms have recurred since she stopped taking her Prilosec. A barium esophagram in November 2010 showed disruption of 3/5 primary esophageal waves. She had scattered tertiary contractions, a small hiatal hernia and small amount of spontaneous gastroesophageal reflux. A 13 mm tablet passed without delay. She has a family history of colon polyps in her parents. Her last colonoscopy in January 2007 and before that in 2001 showed a hyperplastic polyp. She has irritable bowel syndrome with constipation and diarrhea.    Past Medical History  Diagnosis Date  . Osteopenia   . Chronic knee pain   . Arrhythmia     SOMETIMES HER HEART WILL BEAT REALLY FAST FOR NO REASON  . SOB (shortness of breath)   . Heartburn   . Nausea   . Diastolic dysfunction   . GERD (gastroesophageal reflux disease)   . Heart palpitations     Negative event monitor 4/12  . Asthma   . Diverticulosis   . Esophageal dysmotility   . Hyperplastic colon polyp    Past Surgical History  Procedure Date  . Anal fissure repair   . Colonoscopy   . Thyroidectomy   . Heel spur surgery   . Abdominal hysterectomy   . Cholecystectomy   . Liposuction     reports that she has never smoked. She has never used smokeless tobacco. She reports that she does not drink alcohol or use illicit drugs. family history includes Breast cancer in her sister; Colon polyps in an unspecified family member; Coronary artery disease in her father; Diabetes in an unspecified family member; Heart attack in her father; and Hypertension in an unspecified  family member.  There is no history of Colon cancer. No Known Allergies      Review of Systems: Solid food dysphagia. Denies chest pain shortness of breath  The remainder of the 10 point ROS is negative except as outlined in H&P   Physical Exam: General appearance  Well developed, in no distress. Eyes- non icteric. HEENT nontraumatic, normocephalic. Mouth no lesions, tongue papillated, no cheilosis. Neck supple without adenopathy, thyroid not enlarged, no carotid bruits, no JVD. Lungs Clear to auscultation bilaterally. Cor normal S1, normal S2, regular rhythm, no murmur,  quiet precordium. Abdomen: Soft, nontender abdomen with post cholecystectomy scar in right upper quadrant. Normal active bowel sounds. No distention. Rectal: Not done. Extremities no pedal edema. Skin no lesions. Neurological alert and oriented x 3. Psychological normal mood and affect.  Assessment and Plan:  Problem #1 Chronic gastroesophageal reflux with symptoms of dysphagia suggestive of an early esophageal stricture. A recent barium esophagram showed esophageal dysmotility and gastroesophageal reflux. We will proceed with an upper endoscopy and possible dilatation. She needs to go back on Prilosec 20 mg daily and antireflux measures.  Problem #2 Family history of colon polyps and personal history of hyperplastic polyp in January 2007. She will be due for a colonoscopy this year. We will schedule the colonoscopy at the same time as the upper endoscopy. I advised her to start Metamucil or supplemental fiber daily to improve her bowel habits.   08/12/2011 Lina Sar

## 2011-08-12 NOTE — Patient Instructions (Addendum)
You have been scheduled for an endoscopy and colonoscopy with propofol. Please follow the written instructions given to you at your visit today. Please pick up your prep at the pharmacy within the next 1-3 days. CC: Dr Benedetto Goad, Dr Collins Scotland

## 2011-09-30 ENCOUNTER — Encounter: Payer: 59 | Admitting: Internal Medicine

## 2012-01-16 ENCOUNTER — Encounter: Payer: Self-pay | Admitting: Internal Medicine

## 2012-02-11 ENCOUNTER — Other Ambulatory Visit: Payer: Self-pay | Admitting: Dermatology

## 2012-05-05 DIAGNOSIS — K224 Dyskinesia of esophagus: Secondary | ICD-10-CM

## 2012-05-05 HISTORY — DX: Dyskinesia of esophagus: K22.4

## 2012-05-05 HISTORY — PX: COLONOSCOPY: SHX174

## 2012-05-07 ENCOUNTER — Encounter: Payer: Self-pay | Admitting: Internal Medicine

## 2012-05-28 ENCOUNTER — Other Ambulatory Visit: Payer: Self-pay | Admitting: Internal Medicine

## 2012-05-28 ENCOUNTER — Telehealth: Payer: Self-pay | Admitting: *Deleted

## 2012-05-28 ENCOUNTER — Ambulatory Visit (AMBULATORY_SURGERY_CENTER): Payer: Medicare Other | Admitting: *Deleted

## 2012-05-28 VITALS — Ht 64.0 in | Wt 155.4 lb

## 2012-05-28 DIAGNOSIS — Z1211 Encounter for screening for malignant neoplasm of colon: Secondary | ICD-10-CM

## 2012-05-28 DIAGNOSIS — R1310 Dysphagia, unspecified: Secondary | ICD-10-CM

## 2012-05-28 MED ORDER — OMEPRAZOLE 20 MG PO CPDR: 20.0000 mg | DELAYED_RELEASE_CAPSULE | Freq: Every day | ORAL | Status: DC

## 2012-05-28 MED ORDER — MOVIPREP 100 G PO SOLR: ORAL | Status: DC

## 2012-05-28 NOTE — Telephone Encounter (Signed)
rx sent

## 2012-05-28 NOTE — Telephone Encounter (Signed)
I agree with everything you said.

## 2012-05-28 NOTE — Telephone Encounter (Addendum)
Dr. Juanda Chance:  You saw pt in April 2013 for dysphagia.  Pt was scheduled for colon recall and EGD at that time.  Pt cancelled procedures and is now scheduling colon and requesting EGD for dysphagia at same time.  I am scheduling her for EGD/Colon per office note 08/12/11.  Pt says that she is not taking Prilosec because she forgets to take it.  I encouraged pt to resume Prilosec as ordered by you in April.  Instructed pt to take in am 30 minutes before eating.  Please let me know if I need to make changes to any of the above.  Thanks, Olegario Messier in [place].

## 2012-06-11 ENCOUNTER — Encounter: Payer: Self-pay | Admitting: Internal Medicine

## 2012-06-11 ENCOUNTER — Ambulatory Visit (AMBULATORY_SURGERY_CENTER): Payer: Medicare Other | Admitting: Internal Medicine

## 2012-06-11 ENCOUNTER — Encounter: Payer: 59 | Admitting: Internal Medicine

## 2012-06-11 VITALS — BP 114/59 | HR 66 | Temp 96.4°F | Resp 17 | Ht 63.0 in | Wt 152.0 lb

## 2012-06-11 DIAGNOSIS — Z8371 Family history of colonic polyps: Secondary | ICD-10-CM

## 2012-06-11 DIAGNOSIS — K224 Dyskinesia of esophagus: Secondary | ICD-10-CM

## 2012-06-11 DIAGNOSIS — Z86010 Personal history of colonic polyps: Secondary | ICD-10-CM

## 2012-06-11 DIAGNOSIS — R1310 Dysphagia, unspecified: Secondary | ICD-10-CM

## 2012-06-11 DIAGNOSIS — R079 Chest pain, unspecified: Secondary | ICD-10-CM

## 2012-06-11 DIAGNOSIS — Z1211 Encounter for screening for malignant neoplasm of colon: Secondary | ICD-10-CM

## 2012-06-11 DIAGNOSIS — K219 Gastro-esophageal reflux disease without esophagitis: Secondary | ICD-10-CM

## 2012-06-11 MED ORDER — SODIUM CHLORIDE 0.9 % IV SOLN: 500.0000 mL | INTRAVENOUS | Status: DC

## 2012-06-11 NOTE — Op Note (Signed)
[facility] [address] [place], [address]   COLONOSCOPY PROCEDURE REPORT  PATIENT: Gloria Atkins, Gloria Atkins  MR#: [id number] BIRTHDATE: 1945/10/08 , 66  yrs. old GENDER: Female ENDOSCOPIST: Hart Carwin, MD REFERRED BY:  Benedetto Goad, MD PROCEDURE DATE:  06/11/2012 PROCEDURE:   Colonoscopy, surveillance ASA CLASS:   Class II INDICATIONS:Patient's personal history of colon polyps, Patient's family history of colon polyps, and colonoscopy 2001 and 2007- hyperplastic polyps,. MEDICATIONS: MAC sedation, administered by CRNA and propofol (Diprivan) 50mg  IV  DESCRIPTION OF PROCEDURE:   After the risks and benefits and of the procedure were explained, informed consent was obtained.  A digital rectal exam revealed no abnormalities of the rectum.    The [facility] CF-H180AL [id number]  endoscope was introduced through the anus and advanced to the cecum, which was identified by both the appendix and ileocecal valve .  The quality of the prep was good, using MoviPrep .  The instrument was then slowly withdrawn as the colon was fully examined.     COLON FINDINGS: A normal appearing cecum, ileocecal valve, and appendiceal orifice were identified.  The ascending, hepatic flexure, transverse, splenic flexure, descending, sigmoid colon and rectum appeared unremarkable.  No polyps or cancers were seen. There was moderately severe diverticulosis of the sigmoid colon Retroflexed views revealed no abnormalities.     The scope was then withdrawn from the patient and the procedure completed.  COMPLICATIONS: There were no complications. ENDOSCOPIC IMPRESSION:  moderately severe diverticulosis of the left colon  RECOMMENDATIONS: High fiber diet   REPEAT EXAM: In 10 year(s)  for Colonoscopy.  cc:  _______________________________ eSignedHart Carwin, MD 06/11/2012 2:49 PM

## 2012-06-11 NOTE — Progress Notes (Signed)
Pt stable to RR 

## 2012-06-11 NOTE — Patient Instructions (Addendum)
YOU HAD AN ENDOSCOPIC PROCEDURE TODAY AT THE [facility]: Refer to the procedure report that was given to you for any specific questions about what was found during the examination.  If the procedure report does not answer your questions, please call your gastroenterologist to clarify.  If you requested that your care partner not be given the details of your procedure findings, then the procedure report has been included in a sealed envelope for you to review at your convenience later.  YOU SHOULD EXPECT: Some feelings of bloating in the abdomen. Passage of more gas than usual.  Walking can help get rid of the air that was put into your GI tract during the procedure and reduce the bloating. If you had a lower endoscopy (such as a colonoscopy or flexible sigmoidoscopy) you may notice spotting of blood in your stool or on the toilet paper. If you underwent a bowel prep for your procedure, then you may not have a normal bowel movement for a few days.  DIET: see dilation diet sheet  ACTIVITY: Your care partner should take you home directly after the procedure.  You should plan to take it easy, moving slowly for the rest of the day.  You can resume normal activity the day after the procedure however you should NOT DRIVE or use heavy machinery for 24 hours (because of the sedation medicines used during the test).    SYMPTOMS TO REPORT IMMEDIATELY: A gastroenterologist can be reached at any hour.  During normal business hours, 8:30 AM to 5:00 PM Monday through Friday, call [phone].  After hours and on weekends, please call the GI answering service at [phone] who will take a message and have the physician on call contact you.   Following lower endoscopy (colonoscopy or flexible sigmoidoscopy):  Excessive amounts of blood in the stool  Significant tenderness or worsening of abdominal pains  Swelling of the abdomen that is new, acute  Fever of 100F or higher  Following upper  endoscopy (EGD)  Vomiting of blood or coffee ground material  New chest pain or pain under the shoulder blades  Painful or persistently difficult swallowing  New shortness of breath  Fever of 100F or higher  Black, tarry-looking stools  FOLLOW UP: If any biopsies were taken you will be contacted by phone or by letter within the next 1-3 weeks.  Call your gastroenterologist if you have not heard about the biopsies in 3 weeks.  Our staff will call the home number listed on your records the next business day following your procedure to check on you and address any questions or concerns that you may have at that time regarding the information given to you following your procedure. This is a courtesy call and so if there is no answer at the home number and we have not heard from you through the emergency physician on call, we will assume that you have returned to your regular daily activities without incident.  SIGNATURES/CONFIDENTIALITY: You and/or your care partner have signed paperwork which will be entered into your electronic medical record.  These signatures attest to the fact that that the information above on your After Visit Summary has been reviewed and is understood.  Full responsibility of the confidentiality of this discharge information lies with you and/or your care-partner.   Diverticulosis-handout given  High Fiber diet-handout given  Hiatal Hernia-handout given  Repeat colonoscopy in 10 years   Dilation diet-handout given  Stricture-handout given  Continue omeprazole  Anti-reflux regimen

## 2012-06-11 NOTE — Progress Notes (Signed)
Patient did not experience any of the following events: a burn prior to discharge; a fall within the facility; wrong site/side/patient/procedure/implant event; or a hospital transfer or hospital admission upon discharge from the facility. (G8907) Patient did not have preoperative order for IV antibiotic SSI prophylaxis. (G8918)  

## 2012-06-11 NOTE — Op Note (Signed)
[facility] [address] [place], [address]   ENDOSCOPY PROCEDURE REPORT  PATIENT: Gloria, Atkins  MR#: [id number] BIRTHDATE: 02-Sep-1945 , 66  yrs. old GENDER: Female ENDOSCOPIST: Hart Carwin, MD REFERRED BY:  Benedetto Goad, MD PROCEDURE DATE:  06/11/2012 PROCEDURE:  EGD, diagnostic and Savary dilation of esophagus ASA CLASS:     Class II INDICATIONS:  Dysphagia.   hx esophageal dismotility on Barium swallow in 05/2008. MEDICATIONS: MAC sedation, administered by CRNA and propofol (Diprivan) 150mg  IV TOPICAL ANESTHETIC: Cetacaine Spray  DESCRIPTION OF PROCEDURE: After the risks benefits and alternatives of the procedure were thoroughly explained, informed consent was obtained.  The [facility] GIF-H180 [id number] endoscope was introduced through the mouth and advanced to the second portion of the duodenum. Without limitations.  The instrument was slowly withdrawn as the mucosa was fully examined.      [esophagus : esophageal mucosa appeared normal, there were multiple contractions which wre non propulsive  There was spasm at the level of the lower esophageal sphincter. There was moderate resistance in traversing the esophagus. There was nonobstructing stricture. There was small 1-2 cm hiatal hernia. Gastric folds appeared normal. Gastric antrum and pyloric outlet were unremarkable. Retroflexion of endoscope showed no evidence of hiatal hernia. Duodenal bulb and descending duodenum was normal. Savary dilators passed over the guidewire. 16, 17, 18 mm dilators passed with minimal resistance. There was no blood on the dilator          The scope was then withdrawn from the patient and the procedure completed.  COMPLICATIONS: There were no complications. ENDOSCOPIC IMPRESSION:  esophageal dysmotility Past at the lower esophageal sphincter. Status post passage of 1617 and 18 mm dilators small reducible hiatal hernia  RECOMMENDATIONS: I think her dysphagia is mostly  due to dysmotility. She will follow antireflux measures and continue on her PPIs REPEAT EXAM: no  eSigned:  Hart Carwin, MD 06/11/2012 2:43 PM   CC:  PATIENT NAME:  Gloria, Atkins MR#: [id number]

## 2012-06-14 ENCOUNTER — Telehealth: Payer: Self-pay | Admitting: *Deleted

## 2012-06-14 NOTE — Telephone Encounter (Signed)
  Follow up Call-  Call back number 06/11/2012  Post procedure Call Back phone  # [phone]  Permission to leave phone message Yes     Patient questions:  Do you have a fever, pain , or abdominal swelling? no Pain Score  0 *  Have you tolerated food without any problems? yes  Have you been able to return to your normal activities? yes  Do you have any questions about your discharge instructions: Diet   no Medications  no Follow up visit  no  Do you have questions or concerns about your Care? no

## 2012-06-25 ENCOUNTER — Other Ambulatory Visit: Payer: Self-pay | Admitting: Internal Medicine

## 2012-08-03 ENCOUNTER — Other Ambulatory Visit: Payer: Self-pay | Admitting: Urology

## 2012-08-03 ENCOUNTER — Encounter (HOSPITAL_BASED_OUTPATIENT_CLINIC_OR_DEPARTMENT_OTHER): Payer: Self-pay | Admitting: *Deleted

## 2012-08-03 NOTE — Progress Notes (Signed)
To [facility] at 0930-Hg on arrival,Ekg in epic-Npo after Mn-will take omeprazole,levothyroxine with small water that am.

## 2012-08-04 NOTE — H&P (Signed)
ctive Problems Problems  1. Bladder Disorders 596.9 2. Gross Hematuria 599.71 3. Ovarian Cyst Right 620.2  History of Present Illness  Gloria Atkins is a 67 yo WF sent in consultation by Dr. Arlyce Dice for intermittant gross hematuria over the past month.  She has not passed clots.  About 2 weeks prior to the onset of the bleeding she had urgency and frequency with no dysuria.  She had a negative culture.  She had no flank pain.  She has had no prior hematuria, UTI's, stone or GU surgery.   Past Medical History Problems  1. History of  Backache 724.5 2. History of  Diverticulosis 562.10 3. History of  Esophageal Reflux 530.81 4. History of  Hypercholesterolemia 272.0 5. History of  Palpitations 785.1 6. History of  Plantar Fasciitis 728.71  Surgical History Problems  1. History of  Cholecystectomy 2. History of  Foot Surgery 3. History of  Hysterectomy V45.77 4. History of  Injection Of Ligament Plantar Fascia 5. History of  Thyroid Surgery 6. History of  Tonsillectomy  Current Meds 1. Levothyroxine Sodium 88 MCG Oral Tablet; Therapy: (Recorded:31Mar2014) to 2. Omeprazole 20 MG Oral Tablet Delayed Release; Therapy: (Recorded:31Mar2014) to  Allergies Medication  1. No Known Drug Allergies  Family History Problems  1. Paternal history of  Acute Myocardial Infarction V17.3 2. Family history of  Death In The Family Father 3. Family history of  Death In The Family Mother 4. Family history of  Family Health Status Children ___ Living Sons 5. Family history of  Nephrolithiasis  Social History Problems    Caffeine Use   Marital History - Single   Never A Smoker   Occupation: Denied    History of  Alcohol Use  Review of Systems Genitourinary, constitutional, skin, eye, otolaryngeal, hematologic/lymphatic, cardiovascular, pulmonary, endocrine, musculoskeletal, gastrointestinal, neurological and psychiatric system(s) were reviewed and pertinent findings if present are  noted.  Genitourinary: urinary frequency, urinary urgency, dysuria, nocturia and hematuria.  Eyes: blurred vision.  Musculoskeletal: back pain and joint pain.    Vitals Vital Signs [Data Includes: Last 1 Day]  31Mar2014 02:37PM  BMI Calculated: 26.29 BSA Calculated: 1.75 Height: 5 ft 4 in Weight: 154 lb  Blood Pressure: 133 / 83 Temperature: 98.5 F Heart Rate: 80  Physical Exam Constitutional: Well nourished and well developed . No acute distress.  ENT:. The ears and nose are normal in appearance.  Neck: The appearance of the neck is normal and no neck mass is present.  Pulmonary: No respiratory distress and normal respiratory rhythm and effort.  Cardiovascular: Heart rate and rhythm are normal . No peripheral edema.  Abdomen: No masses are palpated. Mild tenderness in the RLQ is present and tenderness in the LLQ is present. mild right CVA tenderness, but no left CVA tenderness. No hernias are palpable. No hepatosplenomegaly noted.  Lymphatics: The posterior cervical, supraclavicular, femoral and inguinal nodes are not enlarged or tender.  Skin: Normal skin turgor, no visible rash and no visible skin lesions.  Neuro/Psych:. Mood and affect are appropriate.  Genitourinary:  The urethra is normal in appearance. Vaginal exam demonstrates atrophy and the vaginal epithelium to be poorly estrogenized.    Results/Data Urine [Data Includes: Last 1 Day]   31Mar2014  COLOR YELLOW   APPEARANCE CLEAR   SPECIFIC GRAVITY <1.005   pH 6.0   GLUCOSE NEG mg/dL  BILIRUBIN NEG   KETONE NEG mg/dL  BLOOD TRACE   PROTEIN NEG mg/dL  UROBILINOGEN 0.2 mg/dL  NITRITE NEG  LEUKOCYTE ESTERASE NEG   SQUAMOUS EPITHELIAL/HPF RARE   WBC 0-2 WBC/hpf  RBC 0-2 RBC/hpf  BACTERIA RARE   CRYSTALS NONE SEEN   CASTS NONE SEEN    Procedure  Procedure: Cystoscopy   Indication: Hematuria.  Informed Consent: Risks, benefits, and potential adverse events were discussed and informed consent was obtained  from the patient.  Prep: The patient was prepped with betadine.  Procedure Note:  Urethral meatus:. No abnormalities.  Anterior urethra: No abnormalities.  Bladder: Visulization was clear. The ureteral orifices were in the normal anatomic position bilaterally and had clear efflux of urine. A systematic survey of the bladder demonstrated no bladder tumors or stones. Examination of the bladder demonstrated erythematous mucosa (patchy on the floor of the bladder at the midline extending left. Could be inflammation or CIS. ). The patient tolerated the procedure well.  Complications: None.    Assessment Assessed  1. Gross Hematuria 599.71 2. Ovarian Cyst Right 620.2 3. Bladder Disorders 596.9   She has had intermittant gross hematuria with a negative CT and erythema on the floor of the bladder that needs further evaluation.  She has a large, assyptomatic right ovarian cyst.   Plan  Gross Hematuria (599.71)  1. AU CT-HEMATURIA PROTOCOL  Done: 31Mar2014 12:00AM 2. Follow-up Schedule Surgery Office  Follow-up  Requested for: 31Mar2014 Health Maintenance (V70.0)  3. UA With REFLEX  Done: 31Mar2014 02:25PM Ovarian Cyst (620.2)  4. Cysto  Done: 31Mar2014 5. Gynecology Referral Referral  Referral  Requested for: 31Mar2014         I will send a urine for cytology with reflex. She is going to need a bladder biopsy with fulguration.   I have reviewed the risks of bleeding, infection, bladder wall injury, thrombotic events and anesthetic complications.  She has a breast reduction scheduled next week, so I will try to get the biopsy done later this week.   The Ovarian cyst should be evaluated by Gynecology so I will refer her to [facility].    URINE CYTOLOGY W/REFLEX FISH  Status: In Progress - Specimen/Data Collected  Done: 31Mar2014 Ordered Today; For: Gross Hematuria (599.71); Ordered By: Brigid ReLoney Loh  Due: 05Apr2014; Last Updated By: Larena Sox   Discussion/Summary  CC:  Dr. Kathee Delton

## 2012-08-05 ENCOUNTER — Encounter (HOSPITAL_BASED_OUTPATIENT_CLINIC_OR_DEPARTMENT_OTHER): Payer: Self-pay | Admitting: *Deleted

## 2012-08-05 ENCOUNTER — Ambulatory Visit (HOSPITAL_BASED_OUTPATIENT_CLINIC_OR_DEPARTMENT_OTHER): Payer: Medicare Other | Admitting: Anesthesiology

## 2012-08-05 ENCOUNTER — Encounter (HOSPITAL_BASED_OUTPATIENT_CLINIC_OR_DEPARTMENT_OTHER): Payer: Self-pay | Admitting: Anesthesiology

## 2012-08-05 ENCOUNTER — Encounter (HOSPITAL_BASED_OUTPATIENT_CLINIC_OR_DEPARTMENT_OTHER)
Admission: RE | Disposition: A | Discharge status: Home / Self Care | Payer: Self-pay | Source: Ambulatory Visit | Attending: Urology

## 2012-08-05 ENCOUNTER — Ambulatory Visit (HOSPITAL_BASED_OUTPATIENT_CLINIC_OR_DEPARTMENT_OTHER)
Admission: RE | Admit: 2012-08-05 | Discharge: 2012-08-05 | Disposition: A | Discharge status: Home / Self Care | Payer: Medicare Other | Source: Ambulatory Visit | Attending: Urology | Admitting: Urology

## 2012-08-05 DIAGNOSIS — N329 Bladder disorder, unspecified: Secondary | ICD-10-CM | POA: Insufficient documentation

## 2012-08-05 DIAGNOSIS — K219 Gastro-esophageal reflux disease without esophagitis: Secondary | ICD-10-CM | POA: Insufficient documentation

## 2012-08-05 DIAGNOSIS — E7800 Pure hypercholesterolemia, unspecified: Secondary | ICD-10-CM | POA: Insufficient documentation

## 2012-08-05 DIAGNOSIS — Z79899 Other long term (current) drug therapy: Secondary | ICD-10-CM | POA: Insufficient documentation

## 2012-08-05 DIAGNOSIS — R310 Gross hematuria: Secondary | ICD-10-CM | POA: Insufficient documentation

## 2012-08-05 DIAGNOSIS — N83209 Unspecified ovarian cyst, unspecified side: Secondary | ICD-10-CM | POA: Insufficient documentation

## 2012-08-05 HISTORY — PX: CYSTOSCOPY: SHX5120

## 2012-08-05 HISTORY — PX: FULGURATION OF BLADDER TUMOR: SHX6261

## 2012-08-05 SURGERY — CYSTOSCOPY
Anesthesia: General | Site: Bladder | Wound class: Clean Contaminated

## 2012-08-05 MED ORDER — ONDANSETRON HCL 4 MG/2ML IJ SOLN
INTRAMUSCULAR | Status: DC | PRN
  Administered 2012-08-05: 4 mg via INTRAVENOUS

## 2012-08-05 MED ORDER — FENTANYL CITRATE 0.05 MG/ML IJ SOLN
25.0000 ug | INTRAMUSCULAR | Status: DC | PRN
  Filled 2012-08-05: qty 1

## 2012-08-05 MED ORDER — ONDANSETRON HCL 4 MG/2ML IJ SOLN
4.0000 mg | Freq: Four times a day (QID) | INTRAMUSCULAR | Status: DC | PRN
  Filled 2012-08-05: qty 2

## 2012-08-05 MED ORDER — ACETAMINOPHEN 650 MG RE SUPP
650.0000 mg | RECTAL | Status: DC | PRN
  Filled 2012-08-05: qty 1

## 2012-08-05 MED ORDER — DEXAMETHASONE SODIUM PHOSPHATE 4 MG/ML IJ SOLN
INTRAMUSCULAR | Status: DC | PRN
  Administered 2012-08-05: 8 mg via INTRAVENOUS

## 2012-08-05 MED ORDER — PROPOFOL 10 MG/ML IV BOLUS
INTRAVENOUS | Status: DC | PRN
  Administered 2012-08-05: 150 mg via INTRAVENOUS

## 2012-08-05 MED ORDER — METOCLOPRAMIDE HCL 5 MG/ML IJ SOLN
INTRAMUSCULAR | Status: DC | PRN
  Administered 2012-08-05: 10 mg via INTRAVENOUS

## 2012-08-05 MED ORDER — SODIUM CHLORIDE 0.9 % IJ SOLN
3.0000 mL | Freq: Two times a day (BID) | INTRAMUSCULAR | Status: DC
  Filled 2012-08-05: qty 3

## 2012-08-05 MED ORDER — STERILE WATER FOR IRRIGATION IR SOLN
Status: DC | PRN
  Administered 2012-08-05: 3000 mL

## 2012-08-05 MED ORDER — SODIUM CHLORIDE 0.9 % IV SOLN
250.0000 mL | INTRAVENOUS | Status: DC | PRN
  Filled 2012-08-05: qty 250

## 2012-08-05 MED ORDER — SCOPOLAMINE 1 MG/3DAYS TD PT72
1.0000 | MEDICATED_PATCH | TRANSDERMAL | Status: DC
  Filled 2012-08-05: qty 1

## 2012-08-05 MED ORDER — FENTANYL CITRATE 0.05 MG/ML IJ SOLN
INTRAMUSCULAR | Status: DC | PRN
  Administered 2012-08-05: 50 ug via INTRAVENOUS

## 2012-08-05 MED ORDER — PHENAZOPYRIDINE HCL 200 MG PO TABS: 200.0000 mg | ORAL_TABLET | Freq: Three times a day (TID) | ORAL | Status: DC | PRN

## 2012-08-05 MED ORDER — PROMETHAZINE HCL 25 MG/ML IJ SOLN
6.2500 mg | INTRAMUSCULAR | Status: DC | PRN
  Filled 2012-08-05: qty 1

## 2012-08-05 MED ORDER — OXYCODONE HCL 5 MG PO TABS
5.0000 mg | ORAL_TABLET | ORAL | Status: DC | PRN
  Filled 2012-08-05: qty 2

## 2012-08-05 MED ORDER — ACETAMINOPHEN 325 MG PO TABS
650.0000 mg | ORAL_TABLET | ORAL | Status: DC | PRN
  Filled 2012-08-05: qty 2

## 2012-08-05 MED ORDER — CIPROFLOXACIN IN D5W 400 MG/200ML IV SOLN
400.0000 mg | INTRAVENOUS | Status: DC
  Filled 2012-08-05: qty 200

## 2012-08-05 MED ORDER — LIDOCAINE HCL (CARDIAC) 20 MG/ML IV SOLN
INTRAVENOUS | Status: DC | PRN
  Administered 2012-08-05: 70 mg via INTRAVENOUS

## 2012-08-05 MED ORDER — LACTATED RINGERS IV SOLN
INTRAVENOUS | Status: DC
  Administered 2012-08-05 (×2): via INTRAVENOUS
  Filled 2012-08-05: qty 1000

## 2012-08-05 MED ORDER — HYDROCODONE-ACETAMINOPHEN 5-325 MG PO TABS: 1.0000 | ORAL_TABLET | Freq: Four times a day (QID) | ORAL | Status: DC | PRN

## 2012-08-05 MED ORDER — MIDAZOLAM HCL 5 MG/5ML IJ SOLN
INTRAMUSCULAR | Status: DC | PRN
  Administered 2012-08-05: 0.5 mg via INTRAVENOUS

## 2012-08-05 MED ORDER — SODIUM CHLORIDE 0.9 % IJ SOLN
3.0000 mL | INTRAMUSCULAR | Status: DC | PRN
  Filled 2012-08-05: qty 3

## 2012-08-05 MED ORDER — LACTATED RINGERS IV SOLN
INTRAVENOUS | Status: DC
  Filled 2012-08-05: qty 1000

## 2012-08-05 SURGICAL SUPPLY — 22 items
BAG DRAIN URO-CYSTO SKYTR STRL (DRAIN) ×2 IMPLANT
CANISTER SUCT LVC 12 LTR MEDI- (MISCELLANEOUS) IMPLANT
CATH FOLEY 2WAY SLVR  5CC 16FR (CATHETERS)
CATH FOLEY 2WAY SLVR 5CC 16FR (CATHETERS) IMPLANT
CATH URET 5FR 28IN OPEN ENDED (CATHETERS) IMPLANT
CLOTH BEACON ORANGE TIMEOUT ST (SAFETY) ×2 IMPLANT
DRAPE CAMERA CLOSED 9X96 (DRAPES) ×2 IMPLANT
ELECT REM PT RETURN 9FT ADLT (ELECTROSURGICAL) ×2
ELECTRODE REM PT RTRN 9FT ADLT (ELECTROSURGICAL) ×1 IMPLANT
GLOVE BIO SURGEON STRL SZ 6.5 (GLOVE) ×2 IMPLANT
GLOVE SURG SS PI 8.0 STRL IVOR (GLOVE) ×2 IMPLANT
GOWN STRL REIN XL XLG (GOWN DISPOSABLE) ×2 IMPLANT
GOWN XL W/COTTON TOWEL STD (GOWNS) ×2 IMPLANT
GUIDEWIRE STR DUAL SENSOR (WIRE) IMPLANT
NDL SAFETY ECLIPSE 18X1.5 (NEEDLE) IMPLANT
NEEDLE HYPO 18GX1.5 SHARP (NEEDLE)
NEEDLE HYPO 22GX1.5 SAFETY (NEEDLE) IMPLANT
NS IRRIG 500ML POUR BTL (IV SOLUTION) IMPLANT
PACK CYSTOSCOPY (CUSTOM PROCEDURE TRAY) ×2 IMPLANT
SYR 20CC LL (SYRINGE) IMPLANT
SYR BULB IRRIGATION 50ML (SYRINGE) ×2 IMPLANT
WATER STERILE IRR 3000ML UROMA (IV SOLUTION) ×2 IMPLANT

## 2012-08-05 NOTE — Interval H&P Note (Signed)
History and Physical Interval Note:  08/05/2012 10:10 AM  Gloria Atkins  has presented today for surgery, with the diagnosis of bladder lesion with hematuria  The various methods of treatment have been discussed with the patient and family. After consideration of risks, benefits and other options for treatment, the patient has consented to  Procedure(s): CYSTOSCOPY BLADDER BIOPSY  (N/A) WITH FULGURATION  (N/A) as a surgical intervention .  The patient's history has been reviewed, patient examined, no change in status, stable for surgery.  I have reviewed the patient's chart and labs.  Questions were answered to the patient's satisfaction.     Tavarious Freel J

## 2012-08-05 NOTE — Anesthesia Procedure Notes (Signed)
Procedure Name: LMA Insertion Date/Time: 08/05/2012 10:20 AM Performed by: Norva Pavlov Pre-anesthesia Checklist: Patient identified, Emergency Drugs available, Suction available and Patient being monitored Patient Re-evaluated:Patient Re-evaluated prior to inductionOxygen Delivery Method: Circle System Utilized Preoxygenation: Pre-oxygenation with 100% oxygen Intubation Type: IV induction Ventilation: Mask ventilation without difficulty LMA: LMA inserted LMA Size: 4.0 Number of attempts: 1 Airway Equipment and Method: bite block Placement Confirmation: positive ETCO2 Tube secured with: Tape Dental Injury: Teeth and Oropharynx as per pre-operative assessment

## 2012-08-05 NOTE — Brief Op Note (Signed)
08/05/2012  10:33 AM  PATIENT:  Gloria Atkins  67 y.o. female  PRE-OPERATIVE DIAGNOSIS:  bladder lesion with hematuria  POST-OPERATIVE DIAGNOSIS:  Same PROCEDURE:  Procedure(s): CYSTOSCOPY BLADDER BIOPSY  (N/A) WITH FULGURATION  (N/A)  SURGEON:  Surgeon(s) and Role:    * Anner Crete, MD - Primary  PHYSICIAN ASSISTANT:   ASSISTANTS: none   ANESTHESIA:   general  EBL:  Total I/O In: 200 [I.V.:200] Out: -   BLOOD ADMINISTERED:none  DRAINS: none   LOCAL MEDICATIONS USED:  NONE  SPECIMEN:  Source of Specimen:  left bladder base   DISPOSITION OF SPECIMEN:  PATHOLOGY  COUNTS:  YES  TOURNIQUET:  * No tourniquets in log *  DICTATION: .Other Dictation: Dictation Number [id number]  PLAN OF CARE: Discharge to home after PACU  PATIENT DISPOSITION:  PACU - hemodynamically stable.   Delay start of Pharmacological VTE agent (>24hrs) due to surgical blood loss or risk of bleeding: not applicable

## 2012-08-05 NOTE — Transfer of Care (Signed)
Immediate Anesthesia Transfer of Care Note  Patient: Gloria Atkins  Procedure(s) Performed: Procedure(s) (LRB): CYSTOSCOPY BLADDER BIOPSY  (N/A) WITH FULGURATION  (N/A)  Patient Location: PACU  Anesthesia Type: General  Level of Consciousness: drowsy  Airway & Oxygen Therapy: Patient Spontanous Breathing and Patient connected to face mask oxygen  Post-op Assessment: Report given to PACU RN and Post -op Vital signs reviewed and stable  Post vital signs: Reviewed and stable  Complications: No apparent anesthesia complications

## 2012-08-05 NOTE — Anesthesia Preprocedure Evaluation (Addendum)
Anesthesia Evaluation  Patient identified by MRN, date of birth, ID band Patient awake    Reviewed: Allergy & Precautions, H&P , NPO status , Patient's Chart, lab work & pertinent test results  Airway Mallampati: II TM Distance: >3 FB Neck ROM: Full    Dental  (+) Edentulous Upper and Edentulous Lower   Pulmonary neg pulmonary ROS, shortness of breath,  breath sounds clear to auscultation  Pulmonary exam normal       Cardiovascular negative cardio ROS  + dysrhythmias Rhythm:Regular Rate:Normal     Neuro/Psych negative neurological ROS  negative psych ROS   GI/Hepatic Neg liver ROS, GERD-  ,  Endo/Other  negative endocrine ROS  Renal/GU negative Renal ROS  negative genitourinary   Musculoskeletal negative musculoskeletal ROS (+)   Abdominal   Peds  Hematology negative hematology ROS (+)   Anesthesia Other Findings   Reproductive/Obstetrics                          Anesthesia Physical Anesthesia Plan  ASA: II  Anesthesia Plan: General   Post-op Pain Management:    Induction: Intravenous  Airway Management Planned: LMA  Additional Equipment:   Intra-op Plan:   Post-operative Plan: Extubation in OR  Informed Consent: I have reviewed the patients History and Physical, chart, labs and discussed the procedure including the risks, benefits and alternatives for the proposed anesthesia with the patient or authorized representative who has indicated his/her understanding and acceptance.   Dental advisory given  Plan Discussed with: CRNA  Anesthesia Plan Comments:         Anesthesia Quick Evaluation

## 2012-08-05 NOTE — Anesthesia Postprocedure Evaluation (Signed)
Anesthesia Post Note  Patient: Gloria Atkins  Procedure(s) Performed: Procedure(s) (LRB): CYSTOSCOPY BLADDER BIOPSY  (N/A) WITH FULGURATION  (N/A)  Anesthesia type: General  Patient location: PACU  Post pain: Pain level controlled  Post assessment: Post-op Vital signs reviewed  Last Vitals:  Filed Vitals:   08/05/12 1044  BP: 107/73  Pulse: 80  Temp: 36.5 C  Resp: 10    Post vital signs: Reviewed  Level of consciousness: sedated  Complications: No apparent anesthesia complications

## 2012-08-06 ENCOUNTER — Encounter (HOSPITAL_BASED_OUTPATIENT_CLINIC_OR_DEPARTMENT_OTHER): Payer: Self-pay | Admitting: Urology

## 2012-08-06 NOTE — Op Note (Signed)
Gloria Atkins, Gloria Atkins NO.:  [id number]  MEDICAL RECORD NO.:  [id number]  LOCATION:                                FACILITY:  [facility]  PHYSICIAN:  Excell Seltzer. Annabell Howells, M.D.    DATE OF BIRTH:  Apr 27, 1946  DATE OF PROCEDURE:  08/05/2012 DATE OF DISCHARGE:                              OPERATIVE REPORT   PREOPERATIVE DIAGNOSIS:  Left bladder lesion.  POSTOPERATIVE DIAGNOSIS:  Left bladder lesion.  PROCEDURE:  Cystoscopy with bladder biopsy and fulguration.  SURGEON:  Excell Seltzer. Annabell Howells, M.D.  ANESTHESIA:  General.  SPECIMEN:  Biopsies from the left base and lateral bladder wall.  DRAINS:  None.  COMPLICATIONS:  None.  INDICATIONS:  [name] is a 67 year old white female, who presented with gross hematuria.  CT scan revealed no renal abnormalities of concern.  She was found on cystoscopy to have mild reddish area at the left base of the bladder and was felt to require biopsy.  FINDINGS OF PROCEDURE:  She was taken to the operating room where she received Cipro.  A general anesthetic was induced.  She was placed in lithotomy position and fitted with PAS hose.  Her perineum and genitalia were prepped with Betadine solution.  She was draped in usual sterile fashion.  Cystoscopy was performed using a 22-French scope and 12-degree lens. Examination revealed a normal urethra with the exception of 2 small polyps.  The bladder wall had minimal trabeculation.  There was some increased vascular density on the left, but I did not see a specific the lesion as I did on the office cystoscopy with a flexible scope.  There was a small reddish area on the dome, but this was from the tip of the scope during the inspection as it was not there on initial view.  The ureteral orifices were unremarkable.  No tumors or stones were noted.  A cup biopsy forceps was used to take 2 biopsies from the left base and lateral wall, and there was increased vascular density.  Great care was taken  to make these superficial.  Once the biopsies were complete, inspection revealed no bladder perforation.  The bladder biopsy sites were then fulgurated with a Bugbee electrode.  The small urethral polyp were fulgurated.  The bladder was drained.  The patient was taken down from lithotomy position.  Her anesthetic was reversed.  She was moved to recovery room in stable condition.  There were no complications.     Excell Seltzer. Annabell Howells, M.D.     JJW/MEDQ  D:  08/05/2012  T:  08/06/2012  Job:  [id number]

## 2012-08-11 ENCOUNTER — Other Ambulatory Visit: Payer: Self-pay

## 2012-09-29 ENCOUNTER — Inpatient Hospital Stay (HOSPITAL_COMMUNITY)
Admission: EM | Admit: 2012-09-29 | Discharge: 2012-10-03 | DRG: 601 | Disposition: A | Discharge status: Home / Self Care | Payer: Medicare Other | Attending: Internal Medicine | Admitting: Internal Medicine

## 2012-09-29 ENCOUNTER — Inpatient Hospital Stay (HOSPITAL_COMMUNITY): Payer: Medicare Other

## 2012-09-29 ENCOUNTER — Encounter (HOSPITAL_COMMUNITY): Payer: Self-pay | Admitting: Emergency Medicine

## 2012-09-29 DIAGNOSIS — M25569 Pain in unspecified knee: Secondary | ICD-10-CM | POA: Diagnosis present

## 2012-09-29 DIAGNOSIS — K589 Irritable bowel syndrome without diarrhea: Secondary | ICD-10-CM

## 2012-09-29 DIAGNOSIS — M8580 Other specified disorders of bone density and structure, unspecified site: Secondary | ICD-10-CM

## 2012-09-29 DIAGNOSIS — G8929 Other chronic pain: Secondary | ICD-10-CM | POA: Diagnosis present

## 2012-09-29 DIAGNOSIS — R002 Palpitations: Secondary | ICD-10-CM

## 2012-09-29 DIAGNOSIS — I5189 Other ill-defined heart diseases: Secondary | ICD-10-CM

## 2012-09-29 DIAGNOSIS — N61 Mastitis without abscess: Principal | ICD-10-CM | POA: Diagnosis present

## 2012-09-29 DIAGNOSIS — K219 Gastro-esophageal reflux disease without esophagitis: Secondary | ICD-10-CM | POA: Diagnosis present

## 2012-09-29 DIAGNOSIS — R0602 Shortness of breath: Secondary | ICD-10-CM

## 2012-09-29 DIAGNOSIS — K5730 Diverticulosis of large intestine without perforation or abscess without bleeding: Secondary | ICD-10-CM

## 2012-09-29 DIAGNOSIS — I499 Cardiac arrhythmia, unspecified: Secondary | ICD-10-CM | POA: Diagnosis present

## 2012-09-29 DIAGNOSIS — R609 Edema, unspecified: Secondary | ICD-10-CM | POA: Diagnosis present

## 2012-09-29 DIAGNOSIS — L0390 Cellulitis, unspecified: Secondary | ICD-10-CM

## 2012-09-29 DIAGNOSIS — Z86010 Personal history of colonic polyps: Secondary | ICD-10-CM

## 2012-09-29 DIAGNOSIS — E039 Hypothyroidism, unspecified: Secondary | ICD-10-CM | POA: Diagnosis present

## 2012-09-29 DIAGNOSIS — R1310 Dysphagia, unspecified: Secondary | ICD-10-CM

## 2012-09-29 DIAGNOSIS — I519 Heart disease, unspecified: Secondary | ICD-10-CM | POA: Diagnosis present

## 2012-09-29 LAB — URINALYSIS, ROUTINE W REFLEX MICROSCOPIC
Hgb urine dipstick: NEGATIVE
Specific Gravity, Urine: 1.02 (ref 1.005–1.030)
pH: 5.5 (ref 5.0–8.0)

## 2012-09-29 LAB — CBC WITH DIFFERENTIAL/PLATELET
Basophils Relative: 0 % (ref 0–1)
Eosinophils Absolute: 0 10*3/uL (ref 0.0–0.7)
Hemoglobin: 12.4 g/dL (ref 12.0–15.0)
MCHC: 33.4 g/dL (ref 30.0–36.0)
Monocytes Relative: 7 % (ref 3–12)
Neutro Abs: 8.6 10*3/uL — ABNORMAL HIGH (ref 1.7–7.7)
Neutrophils Relative %: 77 % (ref 43–77)
Platelets: 273 10*3/uL (ref 150–400)
RBC: 4.37 MIL/uL (ref 3.87–5.11)

## 2012-09-29 LAB — CBC
MCH: 27.8 pg (ref 26.0–34.0)
MCHC: 32.5 g/dL (ref 30.0–36.0)
MCV: 85.6 fL (ref 78.0–100.0)
Platelets: 230 10*3/uL (ref 150–400)
RBC: 4.1 MIL/uL (ref 3.87–5.11)

## 2012-09-29 LAB — COMPREHENSIVE METABOLIC PANEL
ALT: 15 U/L (ref 0–35)
AST: 17 U/L (ref 0–37)
Albumin: 3.9 g/dL (ref 3.5–5.2)
Alkaline Phosphatase: 119 U/L — ABNORMAL HIGH (ref 39–117)
BUN: 9 mg/dL (ref 6–23)
Chloride: 99 mEq/L (ref 96–112)
Potassium: 4.1 mEq/L (ref 3.5–5.1)
Sodium: 136 mEq/L (ref 135–145)
Total Bilirubin: 0.4 mg/dL (ref 0.3–1.2)
Total Protein: 7.2 g/dL (ref 6.0–8.3)

## 2012-09-29 LAB — CREATININE, SERUM: Creatinine, Ser: 0.73 mg/dL (ref 0.50–1.10)

## 2012-09-29 MED ORDER — VANCOMYCIN HCL IN DEXTROSE 1-5 GM/200ML-% IV SOLN
1000.0000 mg | Freq: Two times a day (BID) | INTRAVENOUS | Status: DC
  Administered 2012-09-30 – 2012-10-03 (×8): 1000 mg via INTRAVENOUS
  Filled 2012-09-29 (×9): qty 200

## 2012-09-29 MED ORDER — DOCUSATE SODIUM 100 MG PO CAPS
100.0000 mg | ORAL_CAPSULE | Freq: Two times a day (BID) | ORAL | Status: DC
  Administered 2012-09-29 – 2012-10-03 (×8): 100 mg via ORAL
  Filled 2012-09-29 (×9): qty 1

## 2012-09-29 MED ORDER — ENOXAPARIN SODIUM 40 MG/0.4ML ~~LOC~~ SOLN
40.0000 mg | SUBCUTANEOUS | Status: DC
  Administered 2012-09-29 – 2012-10-02 (×4): 40 mg via SUBCUTANEOUS
  Filled 2012-09-29 (×5): qty 0.4

## 2012-09-29 MED ORDER — SODIUM CHLORIDE 0.9 % IV SOLN
1000.0000 mL | INTRAVENOUS | Status: DC
  Administered 2012-09-29: 1000 mL via INTRAVENOUS

## 2012-09-29 MED ORDER — ACETAMINOPHEN 650 MG RE SUPP: 650.0000 mg | Freq: Four times a day (QID) | RECTAL | Status: DC | PRN

## 2012-09-29 MED ORDER — OXYCODONE HCL 5 MG PO TABS
5.0000 mg | ORAL_TABLET | ORAL | Status: DC | PRN
  Administered 2012-09-29 – 2012-10-02 (×12): 5 mg via ORAL
  Filled 2012-09-29 (×13): qty 1

## 2012-09-29 MED ORDER — ALUM & MAG HYDROXIDE-SIMETH 200-200-20 MG/5ML PO SUSP
30.0000 mL | Freq: Four times a day (QID) | ORAL | Status: DC | PRN
  Filled 2012-09-29: qty 30

## 2012-09-29 MED ORDER — SODIUM CHLORIDE 0.9 % IV SOLN
INTRAVENOUS | Status: DC
  Administered 2012-09-29 – 2012-09-30 (×2): via INTRAVENOUS

## 2012-09-29 MED ORDER — ASPIRIN EC 81 MG PO TBEC
81.0000 mg | DELAYED_RELEASE_TABLET | Freq: Every day | ORAL | Status: DC
  Administered 2012-09-29 – 2012-10-03 (×5): 81 mg via ORAL
  Filled 2012-09-29 (×5): qty 1

## 2012-09-29 MED ORDER — ZOLPIDEM TARTRATE 5 MG PO TABS: 5.0000 mg | ORAL_TABLET | Freq: Every evening | ORAL | Status: DC | PRN

## 2012-09-29 MED ORDER — SODIUM CHLORIDE 0.9 % IV SOLN
1000.0000 mL | Freq: Once | INTRAVENOUS | Status: AC
  Administered 2012-09-29: 1000 mL via INTRAVENOUS

## 2012-09-29 MED ORDER — ACETAMINOPHEN 325 MG PO TABS
650.0000 mg | ORAL_TABLET | Freq: Four times a day (QID) | ORAL | Status: DC | PRN
  Administered 2012-09-30 – 2012-10-01 (×2): 650 mg via ORAL
  Filled 2012-09-29 (×3): qty 2

## 2012-09-29 MED ORDER — ONDANSETRON HCL 4 MG/2ML IJ SOLN
4.0000 mg | Freq: Four times a day (QID) | INTRAMUSCULAR | Status: DC | PRN
  Administered 2012-10-03: 4 mg via INTRAVENOUS
  Filled 2012-09-29: qty 2

## 2012-09-29 MED ORDER — PIPERACILLIN-TAZOBACTAM 3.375 G IVPB 30 MIN
3.3750 g | Freq: Once | INTRAVENOUS | Status: AC
  Administered 2012-09-29: 3.375 g via INTRAVENOUS
  Filled 2012-09-29: qty 50

## 2012-09-29 MED ORDER — SENNOSIDES-DOCUSATE SODIUM 8.6-50 MG PO TABS
1.0000 | ORAL_TABLET | Freq: Every evening | ORAL | Status: DC | PRN
  Filled 2012-09-29: qty 1

## 2012-09-29 MED ORDER — ONDANSETRON HCL 4 MG PO TABS: 4.0000 mg | ORAL_TABLET | Freq: Four times a day (QID) | ORAL | Status: DC | PRN

## 2012-09-29 MED ORDER — PANTOPRAZOLE SODIUM 40 MG PO TBEC
40.0000 mg | DELAYED_RELEASE_TABLET | Freq: Every day | ORAL | Status: DC
  Administered 2012-09-30 – 2012-10-03 (×4): 40 mg via ORAL
  Filled 2012-09-29 (×3): qty 1

## 2012-09-29 MED ORDER — LEVOTHYROXINE SODIUM 88 MCG PO TABS
88.0000 ug | ORAL_TABLET | Freq: Every day | ORAL | Status: DC
Start: 2012-09-30 — End: 2012-10-03
  Administered 2012-09-30 – 2012-10-03 (×4): 88 ug via ORAL
  Filled 2012-09-29 (×7): qty 1

## 2012-09-29 MED ORDER — SODIUM CHLORIDE 0.9 % IV SOLN
INTRAVENOUS | Status: AC
  Administered 2012-09-29: 15:00:00 via INTRAVENOUS

## 2012-09-29 MED ORDER — PIPERACILLIN-TAZOBACTAM 3.375 G IVPB
3.3750 g | Freq: Three times a day (TID) | INTRAVENOUS | Status: DC
  Administered 2012-09-30 – 2012-10-01 (×5): 3.375 g via INTRAVENOUS
  Filled 2012-09-29 (×7): qty 50

## 2012-09-29 MED ORDER — VANCOMYCIN HCL IN DEXTROSE 1-5 GM/200ML-% IV SOLN
1000.0000 mg | Freq: Once | INTRAVENOUS | Status: AC
  Administered 2012-09-29: 1000 mg via INTRAVENOUS
  Filled 2012-09-29: qty 200

## 2012-09-29 MED ORDER — SODIUM CHLORIDE 0.9 % IJ SOLN
3.0000 mL | Freq: Two times a day (BID) | INTRAMUSCULAR | Status: DC
  Administered 2012-09-30 – 2012-10-03 (×2): 3 mL via INTRAVENOUS

## 2012-09-29 MED ORDER — LORATADINE 10 MG PO TABS
10.0000 mg | ORAL_TABLET | Freq: Every day | ORAL | Status: DC | PRN
  Administered 2012-10-02 – 2012-10-03 (×2): 10 mg via ORAL
  Filled 2012-09-29 (×2): qty 1

## 2012-09-29 NOTE — ED Provider Notes (Addendum)
History     CSN: [id number]  Arrival date & time 09/29/12  1032   First MD Initiated Contact with Patient 09/29/12 1144      Chief Complaint  Patient presents with  . Fever    (Consider location/radiation/quality/duration/timing/severity/associated sxs/prior treatment) HPI 67 y.o. Female complaining of fever last night to 104.  She states she had some uri symptoms over weekend.  This am noted right breast redness and discharge.  Patient had breast reduction in April by Dr. Kelly Splinter.  She had been healing well without problems.  She states fever to 102 this a.m.  She went to urgent care and was given ibuprofen and told to come here.  She denies headache, chest pain, cough, dyspnea, vomiting or diarrhea.  She has some nausea.   Past Medical History  Diagnosis Date  . Osteopenia   . Chronic knee pain   . Arrhythmia     SOMETIMES HER HEART WILL BEAT REALLY FAST FOR NO REASON  . Heartburn   . Nausea   . Diastolic dysfunction   . GERD (gastroesophageal reflux disease)   . Heart palpitations     Negative event monitor 4/12  . Diverticulosis   . Hyperplastic colon polyp   . Esophageal dysmotility 1/14    stretching-improved swallowing since     Past Surgical History  Procedure Laterality Date  . Anal fissure repair    . Colonoscopy  1/14  . Thyroidectomy Bilateral 1974  . Heel spur surgery    . Liposuction    . Cholecystectomy  1970's  . Abdominal hysterectomy  1980  . Cystoscopy N/A 08/05/2012    Procedure: CYSTOSCOPY BLADDER BIOPSY ;  Surgeon: Anner Crete, MD;  Location: [facility];  Service: Urology;  Laterality: N/A;  . Fulguration of bladder tumor N/A 08/05/2012    Procedure: WITH FULGURATION ;  Surgeon: Anner Crete, MD;  Location: [facility];  Service: Urology;  Laterality: N/A;    Family History  Problem Relation Age of Onset  . Coronary artery disease Father   . Heart attack Father   . Hypertension    . Diabetes    . Breast cancer  Sister   . Colon polyps    . Colon cancer Neg Hx   . Stomach cancer Neg Hx     History  Substance Use Topics  . Smoking status: Never Smoker   . Smokeless tobacco: Never Used  . Alcohol Use: No    OB History   Grav Para Term Preterm Abortions TAB SAB Ect Mult Living                  Review of Systems  All other systems reviewed and are negative.    Allergies  Review of patient's allergies indicates no known allergies.  Home Medications   Current Outpatient Rx  Name  Route  Sig  Dispense  Refill  . aspirin EC 81 MG EC tablet   Oral   Take 81 mg by mouth daily.           Marland Kitchen levothyroxine (SYNTHROID, LEVOTHROID) 88 MCG tablet   Oral   Take 88 mcg by mouth daily.           Marland Kitchen loratadine (CLARITIN) 10 MG tablet   Oral   Take 10 mg by mouth daily as needed (Sinuses).         Marland Kitchen omeprazole (PRILOSEC) 20 MG capsule   Oral   Take 1 capsule (20 mg  total) by mouth daily.   30 capsule   5     BP 128/76  Pulse 102  Temp(Src) 99.5 F (37.5 C)  Resp 16  SpO2 96%  Physical Exam  Nursing note and vitals reviewed. Constitutional: She is oriented to person, place, and time. She appears well-developed and well-nourished.  HENT:  Head: Normocephalic and atraumatic.  Right Ear: External ear normal.  Left Ear: External ear normal.  Nose: Nose normal.  Mouth/Throat: Oropharynx is clear and moist.  Eyes: Conjunctivae and EOM are normal. Pupils are equal, round, and reactive to light.  Neck: Normal range of motion. Neck supple.  Cardiovascular: Tachycardia present.   Pulmonary/Chest: Effort normal and breath sounds normal.  Abdominal: Soft. Bowel sounds are normal.  Genitourinary:  Left breast with well h ealing incision Right breast- well demarcated erythema, warmth, ttp, some discharge from inferior aspect, indurated,  Musculoskeletal: Normal range of motion.  Neurological: She is alert and oriented to person, place, and time.  Skin: Skin is warm. Rash noted.   Psychiatric: She has a normal mood and affect. Her behavior is normal. Judgment and thought content normal.    ED Course  Procedures (including critical care time)  Labs Reviewed  CBC WITH DIFFERENTIAL - Abnormal; Notable for the following:    WBC 11.2 (*)    Neutro Abs 8.6 (*)    All other components within normal limits  CULTURE, BLOOD (ROUTINE X 2)  CULTURE, BLOOD (ROUTINE X 2)  URINE CULTURE  COMPREHENSIVE METABOLIC PANEL  PROCALCITONIN  URINALYSIS, ROUTINE W REFLEX MICROSCOPIC   No results found.   No diagnosis found.    MDM   Results for orders placed during the hospital encounter of 09/29/12  CBC WITH DIFFERENTIAL      Result Value Range   WBC 11.2 (*) 4.0 - 10.5 K/uL   RBC 4.37  3.87 - 5.11 MIL/uL   Hemoglobin 12.4  12.0 - 15.0 g/dL   HCT 16.1  09.6 - 04.5 %   MCV 84.9  78.0 - 100.0 fL   MCH 28.4  26.0 - 34.0 pg   MCHC 33.4  30.0 - 36.0 g/dL   RDW 40.9  81.1 - 91.4 %   Platelets 273  150 - 400 K/uL   Neutrophils Relative % 77  43 - 77 %   Neutro Abs 8.6 (*) 1.7 - 7.7 K/uL   Lymphocytes Relative 16  12 - 46 %   Lymphs Abs 1.8  0.7 - 4.0 K/uL   Monocytes Relative 7  3 - 12 %   Monocytes Absolute 0.7  0.1 - 1.0 K/uL   Eosinophils Relative 0  0 - 5 %   Eosinophils Absolute 0.0  0.0 - 0.7 K/uL   Basophils Relative 0  0 - 1 %   Basophils Absolute 0.0  0.0 - 0.1 K/uL  COMPREHENSIVE METABOLIC PANEL      Result Value Range   Sodium 136  135 - 145 mEq/L   Potassium 4.1  3.5 - 5.1 mEq/L   Chloride 99  96 - 112 mEq/L   CO2 23  19 - 32 mEq/L   Glucose, Bld 116 (*) 70 - 99 mg/dL   BUN 9  6 - 23 mg/dL   Creatinine, Ser 7.82  0.50 - 1.10 mg/dL   Calcium 9.1  8.4 - 95.6 mg/dL   Total Protein 7.2  6.0 - 8.3 g/dL   Albumin 3.9  3.5 - 5.2 g/dL   AST 17  0 -  37 U/L   ALT 15  0 - 35 U/L   Alkaline Phosphatase 119 (*) 39 - 117 U/L   Total Bilirubin 0.4  0.3 - 1.2 mg/dL   GFR calc non Af Amer 68 (*) >90 mL/min   GFR calc Af Amer 79 (*) >90 mL/min  CG4 I-STAT  (LACTIC ACID)      Result Value Range   Lactic Acid, Venous 1.44  0.5 - 2.2 mmol/L      Date: 09/29/2012  Rate: 89  Rhythm: normal sinus rhythm  QRS Axis: left  Intervals: normal  ST/T Wave abnormalities: nonspecific ST/T changes  Conduction Disutrbances:q waves inferiorly  Narrative Interpretation:   Old EKG Reviewed: unchanged earlier today    Patient with mild leukocytosis, lactic acid normal.  Discussed with Dr.Sanger and plan [place] to assess for abscess.  Discussed breast US with Dr. Jean Rosenthal.  Patient to be admitted for further treatment.   Discussed with Dr. Arthor Captain and plan admission.   Hilario Quarry, MD 09/29/12 1320  Hilario Quarry, MD 09/29/12 [phone]

## 2012-09-29 NOTE — ED Notes (Signed)
Started to run fever last night and went to ucc was found  To have infection in site that she had surgery  ( breast)on in april

## 2012-09-29 NOTE — Progress Notes (Signed)
09/29/2012 patient transfer from the emergency room to 6700 at 1850. Patient is alert, oriented and ambulatory with assist. Patient was assess her bilateral breast was swollen and red. The right breast more red then the left. Patient have a tattoo on the right lower leg. She was place on telemetry box #5 and she is running sinus tach. She was placed on bed alarm and contract was sign. Patient stated she is a little weak, but never fallen before. Vital signs at 1856 was 141/113, 99.6, 120, 16, 90%RA. 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

## 2012-09-29 NOTE — Progress Notes (Signed)
ANTIBIOTIC CONSULT NOTE - INITIAL  Pharmacy Consult for vancomycin and zosyn Indication: rule out sepsis  No Known Allergies  Patient Measurements: weight ~71kg, height ~62 inches   Vital Signs: Temp: 99.5 F (37.5 C) (05/28 1113) BP: 128/76 mmHg (05/28 1113) Pulse Rate: 102 (05/28 1113) Intake/Output from previous day:   Intake/Output from this shift:    Labs:  Recent Labs  09/29/12 1120  WBC 11.2*  HGB 12.4  PLT 273  CREATININE 0.87   The CrCl is unknown because both a height and weight (above a minimum accepted value) are required for this calculation. No results found for this basename: VANCOTROUGH, VANCOPEAK, VANCORANDOM, GENTTROUGH, GENTPEAK, GENTRANDOM, TOBRATROUGH, TOBRAPEAK, TOBRARND, AMIKACINPEAK, AMIKACINTROU, AMIKACIN,  in the last 72 hours   Microbiology: No results found for this or any previous visit (from the past 720 hour(s)).  Medical History: Past Medical History  Diagnosis Date  . Osteopenia   . Chronic knee pain   . Arrhythmia     SOMETIMES HER HEART WILL BEAT REALLY FAST FOR NO REASON  . Heartburn   . Nausea   . Diastolic dysfunction   . GERD (gastroesophageal reflux disease)   . Heart palpitations     Negative event monitor 4/12  . Diverticulosis   . Hyperplastic colon polyp   . Esophageal dysmotility 1/14    stretching-improved swallowing since     Medications:  See med rec  Assessment: Patient is 67 y.o F with hx breast reduction in April admitted to the ED with c/o fever and right breast redness with discharge.  To start broad abx for suspected sepsis.  Patient received vancomycin 1gm and zosyn 3.375gm in the ED at ~12:50PM today.   Goal of Therapy:  Vancomycin trough level 15-20 mcg/ml (will aim for higher trough goal for now until sepsis is r/o)  Plan:  1) vancomycin 1gm IV q12h 2) zosyn 3.375gm IV q8h (infuse over 4 hours)   Gloria Atkins P 09/29/2012,12:43 PM

## 2012-09-29 NOTE — H&P (Signed)
[facility] History and Physical  Gloria Atkins:[id number] DOB: May 25, 1945 DOA: 09/29/2012  Referring physician: Dr. Rosalia Hammers PCP: Pamelia Hoit, MD  Specialists: Dr. Kelly Splinter  Chief Complaint: Fever and right breast redness  HPI: Gloria Atkins is a 67 y.o. female who is a Psychologist, sport and exercise at [employer].  She has a history of hypothyroidism, arrhythmia, GERD, and diastolic dysfunction who had elective bilateral breast reduction surgery on (approx) 08/12/2012.  She states that immediately after the surgery she developed a knot over her nipple that drains a small amount of clear brownish fluid.  Post op she was on antibiotics for 1 week and none since.  On 5/27 she developed a fever (up to 104) headache and chills.  She has also had a sinus infection recently and attributed the HA and fever to that.  This morning she noticed that her right breast had become red, swollen and was draining more than usual.   The ED has contacted Dr. Kelly Splinter and we have been asked to admit.    Review of Systems: + for HA and recent sinus pain/drainage.  The patient denies anorexia, weight loss, vision loss, decreased hearing, hoarseness, chest pain, syncope, dyspnea on exertion, peripheral edema, balance deficits, hemoptysis, abdominal pain, melena, hematochezia, severe indigestion/heartburn, hematuria, incontinence, genital sores, muscle weakness, transient blindness, difficulty walking, abnormal bleeding, enlarged lymph nodes, angioedema, and breast masses.    Past Medical History  Diagnosis Date  . Osteopenia   . Chronic knee pain   . Arrhythmia     SOMETIMES HER HEART WILL BEAT REALLY FAST FOR NO REASON  . Heartburn   . Nausea   . Diastolic dysfunction   . GERD (gastroesophageal reflux disease)   . Heart palpitations     Negative event monitor 4/12  . Diverticulosis   . Hyperplastic colon polyp   . Esophageal dysmotility 1/14    stretching-improved swallowing since    Past Surgical History   Procedure Laterality Date  . Anal fissure repair    . Colonoscopy  1/14  . Thyroidectomy Bilateral 1974  . Heel spur surgery    . Liposuction    . Cholecystectomy  1970's  . Abdominal hysterectomy  1980  . Cystoscopy N/A 08/05/2012    Procedure: CYSTOSCOPY BLADDER BIOPSY ;  Surgeon: Anner Crete, MD;  Location: [facility];  Service: Urology;  Laterality: N/A;  . Fulguration of bladder tumor N/A 08/05/2012    Procedure: WITH FULGURATION ;  Surgeon: Anner Crete, MD;  Location: [facility];  Service: Urology;  Laterality: N/A;   Social History:  reports that she has never smoked. She has never used smokeless tobacco. She reports that she does not drink alcohol or use illicit drugs. Lives at home. She is independent with her ADLs.  No Known Allergies  Family History  Problem Relation Age of Onset  . Coronary artery disease Father   . Heart attack Father   . Hypertension    . Diabetes    . Breast cancer Sister   . Colon polyps    . Colon cancer Neg Hx   . Stomach cancer Neg Hx   No Breast Cancer, No mastitis  Prior to Admission medications   Medication Sig Start Date End Date Taking? Authorizing Provider  aspirin EC 81 MG EC tablet Take 81 mg by mouth daily.     Yes Historical Provider, MD  levothyroxine (SYNTHROID, LEVOTHROID) 88 MCG tablet Take 88 mcg by mouth daily.  Yes Historical Provider, MD  loratadine (CLARITIN) 10 MG tablet Take 10 mg by mouth daily as needed (Sinuses).   Yes Historical Provider, MD  omeprazole (PRILOSEC) 20 MG capsule Take 1 capsule (20 mg total) by mouth daily. 06/25/12  Yes Hart Carwin, MD   Physical Exam: Filed Vitals:   09/29/12 1113 09/29/12 1254 09/29/12 1429  BP: 128/76    Pulse: 102    Temp: 99.5 F (37.5 C) 99 F (37.2 C) 98.5 F (36.9 C)  TempSrc:  Oral Oral  Resp: 16  18  SpO2: 96%  97%     General:  A&O, NAD, Wd, Wn, 67 yo female  Eyes: Perl, Sclera clear  ENT: no erythema or exudate in  oropharynx  Neck: supple, no lymphadenopathy  Cardiovascular: rrr, no m/r/g, no JVD, no LLE  Respiratory: CTA, no w/c/r  Abdomen: soft, nt, nd +BS  Skin: Right breast erythematous and swollen over bottom 2/3s.  Clear drainage coming from aerola  Musculoskeletal: 5/5 strength in all 4 extremities  Psychiatric: A&O, Cooperative, appropriate, well groomed  Neurologic: cn 2-12 grossly intact.  Non focal  Labs on Admission:  Basic Metabolic Panel:  Recent Labs Lab 09/29/12 1120  NA 136  K 4.1  CL 99  CO2 23  GLUCOSE 116*  BUN 9  CREATININE 0.87  CALCIUM 9.1   Liver Function Tests:  Recent Labs Lab 09/29/12 1120  AST 17  ALT 15  ALKPHOS 119*  BILITOT 0.4  PROT 7.2  ALBUMIN 3.9   CBC:  Recent Labs Lab 09/29/12 1120  WBC 11.2*  NEUTROABS 8.6*  HGB 12.4  HCT 37.1  MCV 84.9  PLT 273    Radiological Exams on Admission: Dg Chest 2 View  09/29/2012   *RADIOLOGY REPORT*  Clinical Data: Fever.  CHEST - 2 VIEW  Comparison: 05/11/2008  Findings: Minimal scarring/atelectasis present in the right lower lung.  There is no evidence of focal airspace consolidation, edema or pleural fluid.  Heart size is normal.  Visualized bony structures are unremarkable.  IMPRESSION: No active disease.   Original Report Authenticated By: Irish Lack, M.D.   US Breast Right  09/29/2012   *RADIOLOGY REPORT*  Clinical data:  Recent breast reduction surgery, cellulitis, discharge, swelling, question breast abscess  ULTRASOUND RIGHT BREAST:  Technique:  Sonography of the right breast was performed at the area of clinical concern at approximately the 3 o'clock position, at the site of redness and symptoms.  Findings: Skin edema is identified at the site of clinical concern. No focal solid or cystic mass is sonographically evident. No shadowing calcifications or abnormal fluid collections are identified. Unremarkable breast tissue and fat are visualized.  IMPRESSION: Skin thickening/edema at  site of clinical concern. Otherwise negative ultrasound of the right breast, without identification of a mass, abscess or hematoma. Clinical management recommended. Follow-up non-emergent outpatient mammography recommended if symptoms do not completely resolve.   Original Report Authenticated By: Ulyses Southward, M.D.    EKG: NS  Assessment/Plan Principal Problem:   Cellulitis of breast Active Problems:   GERD   Hypothyroidism   Cellulitis of the right breast S/P breast reduction surgery in April 2014 Blood cultures obtained Vanc / Zosyn started in ED  Dr. Kelly Splinter (Plastic Surgery) consulted  Genella Rife Continue PPI  Hypothyroidism Continue Synthroid.   Code Status:  Full Family Communication:  Disposition Plan: inpatient.  To home when appropriate.  Time spent: 60 min  Conley Canal [facility] Pager [phone]  If 7PM-7AM, please  contact night-coverage [email or web address] Password [facility] 09/29/2012, 2:48 PM

## 2012-09-29 NOTE — H&P (Signed)
Addendum  Patient seen and examined, chart and data base reviewed.  I agree with the above assessment and plan.  For full details please see Mrs. Algis Downs PA note.  Right breast cellulitis on vancomycin and Zosyn. PRS to see.   Clint Lipps, MD [facility] Pager: [phone] 09/29/2012, 3:10 PM

## 2012-09-29 NOTE — ED Notes (Signed)
Redness traced area at right and left breast. Redness more so at right breast.

## 2012-09-30 DIAGNOSIS — K219 Gastro-esophageal reflux disease without esophagitis: Secondary | ICD-10-CM

## 2012-09-30 DIAGNOSIS — R002 Palpitations: Secondary | ICD-10-CM

## 2012-09-30 LAB — URINE CULTURE: Colony Count: 2000

## 2012-09-30 LAB — CBC
Hemoglobin: 10.3 g/dL — ABNORMAL LOW (ref 12.0–15.0)
MCH: 28.2 pg (ref 26.0–34.0)
MCHC: 32.4 g/dL (ref 30.0–36.0)
MCV: 87.1 fL (ref 78.0–100.0)
RBC: 3.65 MIL/uL — ABNORMAL LOW (ref 3.87–5.11)

## 2012-09-30 LAB — BASIC METABOLIC PANEL
BUN: 7 mg/dL (ref 6–23)
CO2: 22 mEq/L (ref 19–32)
Calcium: 8 mg/dL — ABNORMAL LOW (ref 8.4–10.5)
GFR calc non Af Amer: 87 mL/min — ABNORMAL LOW (ref >90)
Glucose, Bld: 121 mg/dL — ABNORMAL HIGH (ref 70–99)
Sodium: 137 mEq/L (ref 135–145)

## 2012-09-30 NOTE — Progress Notes (Signed)
[facility] PROGRESS NOTE  JALESHA PLOTZ UJW:[id number] DOB: 08-Nov-1945 DOA: 09/29/2012 PCP: Pamelia Hoit, MD  Assessment/Plan: Cellulitis of the right breast -Continue empiric vancomycin and Zosyn -Will narrow spectrum of antibiotics pending culture data -Ultrasound right breast negative for abscess Hypothyroidism -Continue Synthroid GERD -Continue protonix   Family Communication:   Pt at beside Disposition Plan:   Home when medically stable        Procedures/Studies: Dg Chest 2 View  09/29/2012   *RADIOLOGY REPORT*  Clinical Data: Fever.  CHEST - 2 VIEW  Comparison: 05/11/2008  Findings: Minimal scarring/atelectasis present in the right lower lung.  There is no evidence of focal airspace consolidation, edema or pleural fluid.  Heart size is normal.  Visualized bony structures are unremarkable.  IMPRESSION: No active disease.   Original Report Authenticated By: Irish Lack, M.D.   US Breast Right  09/29/2012   *RADIOLOGY REPORT*  Clinical data:  Recent breast reduction surgery, cellulitis, discharge, swelling, question breast abscess  ULTRASOUND RIGHT BREAST:  Technique:  Sonography of the right breast was performed at the area of clinical concern at approximately the 3 o'clock position, at the site of redness and symptoms.  Findings: Skin edema is identified at the site of clinical concern. No focal solid or cystic mass is sonographically evident. No shadowing calcifications or abnormal fluid collections are identified. Unremarkable breast tissue and fat are visualized.  IMPRESSION: Skin thickening/edema at site of clinical concern. Otherwise negative ultrasound of the right breast, without identification of a mass, abscess or hematoma. Clinical management recommended. Follow-up non-emergent outpatient mammography recommended if symptoms do not completely resolve.   Original Report Authenticated By: Ulyses Southward, M.D.         Subjective:  Patient complains of  pain in the right breast. It is a little bit better than yesterday. Denies any nausea, vomiting, diarrhea, fevers, chills, dysuria, abdominal pain.  Objective: Filed Vitals:   09/29/12 2145 09/30/12 0500 09/30/12 0550 09/30/12 0952  BP: 119/53 99/61  111/58  Pulse: 113 83  68  Temp: 98.9 F (37.2 C) 98.5 F (36.9 C)  97.3 F (36.3 C)  TempSrc: Oral Oral  Oral  Resp: 16   15  Height: 5\' 2"  (1.575 m)     Weight: 69.8 kg (153 lb 14.1 oz)     SpO2:  88% 96% 94%    Intake/Output Summary (Last 24 hours) at 09/30/12 1239 Last data filed at 09/30/12 0800  Gross per 24 hour  Intake 1561.25 ml  Output    350 ml  Net 1211.25 ml   Weight change:  Exam:   General:  Pt is alert, follows commands appropriately, not in acute distress  HEENT: No icterus, No thrush,  [place]/AT  Cardiovascular: RRR, S1/S2, no rubs, no gallops, positive PVCs   Respiratory: CTA bilaterally, no wheezing, no crackles, no rhonchi  Abdomen: Soft/+BS, non tender, non distended, no guarding  Extremities: No edema, No lymphangitis, No petechiae, No rashes, no synovitis  Right breast --erythema from the inferior portion extending up to the nipple without any crepitance or open draining wounds    left breast with minimal erythema around the nipple without any open draining wounds.  Data Reviewed: Basic Metabolic Panel:  Recent Labs Lab 09/29/12 1120 09/29/12 2038 09/30/12 0610  NA 136  --  137  K 4.1  --  3.7  CL 99  --  106  CO2 23  --  22  GLUCOSE 116*  --  121*  BUN 9  --  7  CREATININE 0.87 0.73 0.75  CALCIUM 9.1  --  8.0*   Liver Function Tests:  Recent Labs Lab 09/29/12 1120  AST 17  ALT 15  ALKPHOS 119*  BILITOT 0.4  PROT 7.2  ALBUMIN 3.9   No results found for this basename: LIPASE, AMYLASE,  in the last 168 hours No results found for this basename: AMMONIA,  in the last 168 hours CBC:  Recent Labs Lab 09/29/12 1120 09/29/12 2038 09/30/12 0610  WBC 11.2* 9.5 6.6  NEUTROABS  8.6*  --   --   HGB 12.4 11.4* 10.3*  HCT 37.1 35.1* 31.8*  MCV 84.9 85.6 87.1  PLT 273 230 217   Cardiac Enzymes: No results found for this basename: CKTOTAL, CKMB, CKMBINDEX, TROPONINI,  in the last 168 hours BNP: No components found with this basename: POCBNP,  CBG: No results found for this basename: GLUCAP,  in the last 168 hours  Recent Results (from the past 240 hour(s))  CULTURE, BLOOD (ROUTINE X 2)     Status: None   Collection Time    09/29/12 12:30 PM      Result Value Range Status   Specimen Description BLOOD ARM RIGHT   Final   Special Requests BOTTLES DRAWN AEROBIC ONLY 6CC   Final   Culture  Setup Time 09/29/2012 17:11   Final   Culture     Final   Value:        BLOOD CULTURE RECEIVED NO GROWTH TO DATE CULTURE WILL BE HELD FOR 5 DAYS BEFORE ISSUING A FINAL NEGATIVE REPORT   Report Status PENDING   Incomplete  CULTURE, BLOOD (ROUTINE X 2)     Status: None   Collection Time    09/29/12 12:36 PM      Result Value Range Status   Specimen Description BLOOD ARM LEFT   Final   Special Requests BOTTLES DRAWN AEROBIC ONLY 5CC   Final   Culture  Setup Time 09/29/2012 17:12   Final   Culture     Final   Value:        BLOOD CULTURE RECEIVED NO GROWTH TO DATE CULTURE WILL BE HELD FOR 5 DAYS BEFORE ISSUING A FINAL NEGATIVE REPORT   Report Status PENDING   Incomplete     Scheduled Meds: . aspirin EC  81 mg Oral Daily  . docusate sodium  100 mg Oral BID  . enoxaparin (LOVENOX) injection  40 mg Subcutaneous Q24H  . levothyroxine  88 mcg Oral QAC breakfast  . pantoprazole  40 mg Oral Daily  . piperacillin-tazobactam (ZOSYN)  IV  3.375 g Intravenous Q8H  . sodium chloride  3 mL Intravenous Q12H  . vancomycin  1,000 mg Intravenous Q12H   Continuous Infusions: . sodium chloride 1,000 mL (09/29/12 1253)  . sodium chloride 50 mL/hr at 09/29/12 2005     Armella Stogner, DO  [facility] Pager [phone]  If 7PM-7AM, please contact night-coverage [email or web address] Password  [facility] 09/30/2012, 12:39 PM   LOS: 1 day

## 2012-09-30 NOTE — Progress Notes (Signed)
UR COMPLETED  

## 2012-10-01 DIAGNOSIS — L0291 Cutaneous abscess, unspecified: Secondary | ICD-10-CM

## 2012-10-01 NOTE — Progress Notes (Signed)
  Subjective  Cellulitis of the breasts.  Feeling better today. Objective: Vital signs in last 24 hours: Temp:  [97.4 F (36.3 C)-97.6 F (36.4 C)] 97.6 F (36.4 C) (05/30 0453) Pulse Rate:  [83-91] 91 (05/30 0914) Resp:  [14-18] 18 (05/30 0914) BP: (109-118)/(62-75) 118/75 mmHg (05/30 0914) SpO2:  [95 %] 95 % (05/30 0914) Weight:  [70.806 kg (156 lb 1.6 oz)] 70.806 kg (156 lb 1.6 oz) (05/29 2300) Last BM Date: 09/27/12  Intake/Output from previous day: 05/29 0701 - 05/30 0700 In: 2143.3 [P.O.:480; I.V.:1113.3; IV Piggyback:550] Out: 1000 [Urine:1000] Intake/Output this shift: Total I/O In: 240 [P.O.:240] Out: 1300 [Urine:1300]  General appearance: alert, cooperative and no distress Incision/Wound: The right breast is red and warm to touch.  It has improved with the antibiotics.  No sign of necrosis.  Very small breakdown in the skin on the right breast at the inframammary fold.  Lab Results:   Recent Labs  09/29/12 2038 09/30/12 0610  WBC 9.5 6.6  HGB 11.4* 10.3*  HCT 35.1* 31.8*  PLT 230 217   BMET  Recent Labs  09/29/12 1120 09/29/12 2038 09/30/12 0610  NA 136  --  137  K 4.1  --  3.7  CL 99  --  106  CO2 23  --  22  GLUCOSE 116*  --  121*  BUN 9  --  7  CREATININE 0.87 0.73 0.75  CALCIUM 9.1  --  8.0*   PT/INR No results found for this basename: LABPROT, INR,  in the last 72 hours ABG No results found for this basename: PHART, PCO2, PO2, HCO3,  in the last 72 hours  Studies/Results: No results found.  Anti-infectives: Anti-infectives   Start     Dose/Rate Route Frequency Ordered Stop   09/30/12 0100  vancomycin (VANCOCIN) IVPB 1000 mg/200 mL premix     1,000 mg 200 mL/hr over 60 Minutes Intravenous Every 12 hours 09/29/12 1257     09/29/12 1800  piperacillin-tazobactam (ZOSYN) IVPB 3.375 g  Status:  Discontinued     3.375 g 12.5 mL/hr over 240 Minutes Intravenous Every 8 hours 09/29/12 1257 10/01/12 1409   09/29/12 1215   piperacillin-tazobactam (ZOSYN) IVPB 3.375 g     3.375 g 100 mL/hr over 30 Minutes Intravenous  Once 09/29/12 1206 09/29/12 1338   09/29/12 1215  vancomycin (VANCOCIN) IVPB 1000 mg/200 mL premix     1,000 mg 200 mL/hr over 60 Minutes Intravenous  Once 09/29/12 1206 09/29/12 1428      Assessment/Plan: s/p * No surgery found * Recommend the patient be sure to take Vit C 500 mg twice a day, multivitamin daily and zinc 220 mg daily.  Triple antibiotic to the right breast at the inframammary fold.  Follow up in the office when discharged.  LOS: 2 days    [facility] 10/01/2012

## 2012-10-01 NOTE — Progress Notes (Signed)
[facility] PROGRESS NOTE  Gloria Atkins HYQ:[id number] DOB: 28-Dec-1945 DOA: 09/29/2012 PCP: Pamelia Hoit, MD  Assessment/Plan: Cellulitis of the right breast  -Exam reveals regressing erythema, slight improvement in edema -Continue empiric vancomycin  -Discontinue Zosyn -Will narrow spectrum of antibiotics pending culture data  -Ultrasound right breast negative for abscess  -I spoke with Dr. Kelly Splinter who will come to see patient -Morning CBC Hypothyroidism  -Continue Synthroid  GERD  -Continue protonix  Family Communication: Pt at beside  Disposition Plan: Home when medically stable     Antibiotics:  Vancomycin 09/29/2012>>>  Zosyn 09/29/2012>>> 10/01/2012    Procedures/Studies: Dg Chest 2 View  09/29/2012   *RADIOLOGY REPORT*  Clinical Data: Fever.  CHEST - 2 VIEW  Comparison: 05/11/2008  Findings: Minimal scarring/atelectasis present in the right lower lung.  There is no evidence of focal airspace consolidation, edema or pleural fluid.  Heart size is normal.  Visualized bony structures are unremarkable.  IMPRESSION: No active disease.   Original Report Authenticated By: Irish Lack, M.D.   US Breast Right  09/29/2012   *RADIOLOGY REPORT*  Clinical data:  Recent breast reduction surgery, cellulitis, discharge, swelling, question breast abscess  ULTRASOUND RIGHT BREAST:  Technique:  Sonography of the right breast was performed at the area of clinical concern at approximately the 3 o'clock position, at the site of redness and symptoms.  Findings: Skin edema is identified at the site of clinical concern. No focal solid or cystic mass is sonographically evident. No shadowing calcifications or abnormal fluid collections are identified. Unremarkable breast tissue and fat are visualized.  IMPRESSION: Skin thickening/edema at site of clinical concern. Otherwise negative ultrasound of the right breast, without identification of a mass, abscess or hematoma. Clinical  management recommended. Follow-up non-emergent outpatient mammography recommended if symptoms do not completely resolve.   Original Report Authenticated By: Ulyses Southward, M.D.         Subjective: Patient feels anxious to go home. She states that her breast pain is a little bit better than the day of admission. She is still concerned about the edema. She denies any fevers, chills, chest discomfort, shortness of breath, nausea, vomiting, diarrhea, abdominal pain. No other rashes.  Objective: Filed Vitals:   09/30/12 2040 09/30/12 2300 10/01/12 0453 10/01/12 0914  BP:   113/62 118/75  Pulse:   91 91  Temp:   97.6 F (36.4 C)   TempSrc:   Oral   Resp:   14 18  Height:      Weight:  70.806 kg (156 lb 1.6 oz)    SpO2: 95%  95% 95%    Intake/Output Summary (Last 24 hours) at 10/01/12 1411 Last data filed at 10/01/12 0900  Gross per 24 hour  Intake 1653.33 ml  Output   1800 ml  Net -146.67 ml   Weight change: 1.006 kg (2 lb 3.5 oz) Exam:   General:  Pt is alert, follows commands appropriately, not in acute distress  HEENT: No icterus, No thrush, No neck mass, [place]/AT  Cardiovascular: RRR, S1/S2, no rubs, no gallops  Respiratory: CTA bilaterally, no wheezing, no crackles, no rhonchi  Abdomen: Soft/+BS, non tender, non distended, no guarding  Extremities: No edema, No lymphangitis, No petechiae, No rashes, no synovitis; right breast with erythema extending from 6:00 to the nipple line--no draining wounds, no necrosis, no crepitance  Data Reviewed: Basic Metabolic Panel:  Recent Labs Lab 09/29/12 1120 09/29/12 2038 09/30/12 0610  NA 136  --  137  K 4.1  --  3.7  CL 99  --  106  CO2 23  --  22  GLUCOSE 116*  --  121*  BUN 9  --  7  CREATININE 0.87 0.73 0.75  CALCIUM 9.1  --  8.0*   Liver Function Tests:  Recent Labs Lab 09/29/12 1120  AST 17  ALT 15  ALKPHOS 119*  BILITOT 0.4  PROT 7.2  ALBUMIN 3.9   No results found for this basename: LIPASE, AMYLASE,  in  the last 168 hours No results found for this basename: AMMONIA,  in the last 168 hours CBC:  Recent Labs Lab 09/29/12 1120 09/29/12 2038 09/30/12 0610  WBC 11.2* 9.5 6.6  NEUTROABS 8.6*  --   --   HGB 12.4 11.4* 10.3*  HCT 37.1 35.1* 31.8*  MCV 84.9 85.6 87.1  PLT 273 230 217   Cardiac Enzymes: No results found for this basename: CKTOTAL, CKMB, CKMBINDEX, TROPONINI,  in the last 168 hours BNP: No components found with this basename: POCBNP,  CBG: No results found for this basename: GLUCAP,  in the last 168 hours  Recent Results (from the past 240 hour(s))  CULTURE, BLOOD (ROUTINE X 2)     Status: None   Collection Time    09/29/12 12:30 PM      Result Value Range Status   Specimen Description BLOOD ARM RIGHT   Final   Special Requests BOTTLES DRAWN AEROBIC ONLY 6CC   Final   Culture  Setup Time 09/29/2012 17:11   Final   Culture     Final   Value:        BLOOD CULTURE RECEIVED NO GROWTH TO DATE CULTURE WILL BE HELD FOR 5 DAYS BEFORE ISSUING A FINAL NEGATIVE REPORT   Report Status PENDING   Incomplete  CULTURE, BLOOD (ROUTINE X 2)     Status: None   Collection Time    09/29/12 12:36 PM      Result Value Range Status   Specimen Description BLOOD ARM LEFT   Final   Special Requests BOTTLES DRAWN AEROBIC ONLY 5CC   Final   Culture  Setup Time 09/29/2012 17:12   Final   Culture     Final   Value:        BLOOD CULTURE RECEIVED NO GROWTH TO DATE CULTURE WILL BE HELD FOR 5 DAYS BEFORE ISSUING A FINAL NEGATIVE REPORT   Report Status PENDING   Incomplete  URINE CULTURE     Status: None   Collection Time    09/29/12 12:44 PM      Result Value Range Status   Specimen Description URINE, CLEAN CATCH   Final   Special Requests NONE   Final   Culture  Setup Time 09/29/2012 17:27   Final   Colony Count 2,000 COLONIES/ML   Final   Culture INSIGNIFICANT GROWTH   Final   Report Status 09/30/2012 FINAL   Final     Scheduled Meds: . aspirin EC  81 mg Oral Daily  . docusate sodium   100 mg Oral BID  . enoxaparin (LOVENOX) injection  40 mg Subcutaneous Q24H  . levothyroxine  88 mcg Oral QAC breakfast  . pantoprazole  40 mg Oral Daily  . sodium chloride  3 mL Intravenous Q12H  . vancomycin  1,000 mg Intravenous Q12H   Continuous Infusions: . sodium chloride 1,000 mL (09/29/12 1253)  . sodium chloride 50 mL/hr at 09/30/12 1831     Gloria Hogenson, DO  [facility] Pager [phone]  If 7PM-7AM, please contact night-coverage [email or web address] Password [email or web address] 10/01/2012, 2:11 PM   LOS: 2 days

## 2012-10-02 LAB — CBC WITH DIFFERENTIAL/PLATELET
Basophils Absolute: 0 10*3/uL (ref 0.0–0.1)
Basophils Relative: 1 % (ref 0–1)
Eosinophils Absolute: 0.3 10*3/uL (ref 0.0–0.7)
Eosinophils Relative: 4 % (ref 0–5)
HCT: 30.4 % — ABNORMAL LOW (ref 36.0–46.0)
MCHC: 32.9 g/dL (ref 30.0–36.0)
MCV: 84.4 fL (ref 78.0–100.0)
Monocytes Absolute: 0.5 10*3/uL (ref 0.1–1.0)
Platelets: 252 10*3/uL (ref 150–400)
RDW: 12.5 % (ref 11.5–15.5)

## 2012-10-02 NOTE — Progress Notes (Signed)
ANTIBIOTIC CONSULT NOTE  Pharmacy Consult for Vancomycin  Indication: breast cellulitis  No Known Allergies   Recent Labs  09/29/12 1120 09/29/12 2038 09/30/12 0610 10/02/12 0540  WBC 11.2* 9.5 6.6 6.3  HGB 12.4 11.4* 10.3* 10.0*  PLT 273 230 217 252  CREATININE 0.87 0.73 0.75  --     Assessment: Patient is 67 y.o F with hx breast reduction in April admitted to the ED with c/o fever and right breast redness with discharge.   Day 3 of vancomycin therapy.  WBC WNL, afebrile, cultures negative to date  Scr stable  Goal of Therapy:  Vancomycin trough level 15-20 mcg/ml (will aim for higher trough goal for now until sepsis is r/o)  Plan:  1) Continue Vancomycin 1gm IV q12h 2) Continue to follow  Thank you. Okey Regal, PharmD [phone]  10/02/2012,10:09 AM

## 2012-10-02 NOTE — Progress Notes (Signed)
[facility] PROGRESS NOTE  Gloria Atkins:[id number] DOB: 1946/02/04 DOA: 09/29/2012 PCP: Pamelia Hoit, MD  Assessment/Plan: Cellulitis of the right breast  -Exam reveals regressing erythema, slight improvement in edema  -Right lower quadrant area tenderness likely due to patient's of dependent edema of the breast -I have asked the patient to wear a brassiere today to see if this helps the patient's edema and pain in the right lower quadrant of the breast -Continue empiric vancomycin  -Discontinue Zosyn  -Will narrow spectrum of antibiotics pending culture data  -Ultrasound right breast negative for abscess  -Morning CBC  Hypothyroidism  -Continue Synthroid  GERD  -Continue protonix  Family Communication: Pt at beside  Disposition Plan: Home when medically stable  Antibiotics:  Vancomycin 09/29/2012>>>  Zosyn 09/29/2012>>> 10/01/2012        Procedures/Studies: Dg Chest 2 View  09/29/2012   *RADIOLOGY REPORT*  Clinical Data: Fever.  CHEST - 2 VIEW  Comparison: 05/11/2008  Findings: Minimal scarring/atelectasis present in the right lower lung.  There is no evidence of focal airspace consolidation, edema or pleural fluid.  Heart size is normal.  Visualized bony structures are unremarkable.  IMPRESSION: No active disease.   Original Report Authenticated By: Irish Lack, M.D.   US Breast Right  09/29/2012   *RADIOLOGY REPORT*  Clinical data:  Recent breast reduction surgery, cellulitis, discharge, swelling, question breast abscess  ULTRASOUND RIGHT BREAST:  Technique:  Sonography of the right breast was performed at the area of clinical concern at approximately the 3 o'clock position, at the site of redness and symptoms.  Findings: Skin edema is identified at the site of clinical concern. No focal solid or cystic mass is sonographically evident. No shadowing calcifications or abnormal fluid collections are identified. Unremarkable breast tissue and fat are  visualized.  IMPRESSION: Skin thickening/edema at site of clinical concern. Otherwise negative ultrasound of the right breast, without identification of a mass, abscess or hematoma. Clinical management recommended. Follow-up non-emergent outpatient mammography recommended if symptoms do not completely resolve.   Original Report Authenticated By: Ulyses Southward, M.D.         Subjective: Patient denies fevers, chills, chest pain, shortness breath, nausea, vomiting, diarrhea, abdominal pain. She is complaining of some pain in the right lower quadrant of the breast.  Objective: Filed Vitals:   10/01/12 0914 10/01/12 2146 10/02/12 0429 10/02/12 0820  BP: 118/75 126/71 111/55 120/65  Pulse: 91 93 84 97  Temp:  98.3 F (36.8 C) 98.2 F (36.8 C) 98.7 F (37.1 C)  TempSrc:  Oral Oral Oral  Resp: 18 18 18 18   Height:  5\' 2"  (1.575 m)    Weight:  71.81 kg (158 lb 5 oz)    SpO2: 95% 100% 100% 100%    Intake/Output Summary (Last 24 hours) at 10/02/12 1359 Last data filed at 10/02/12 0845  Gross per 24 hour  Intake 1486.67 ml  Output   2800 ml  Net -1313.33 ml   Weight change: 1.004 kg (2 lb 3.4 oz) Exam:   General:  Pt is alert, follows commands appropriately, not in acute distress  HEENT: No icterus, No thrush,[place]/AT  Cardiovascular: RRR, S1/S2, no rubs, no gallops  Respiratory: CTA bilaterally, no wheezing, no crackles, no rhonchi  Abdomen: Soft/+BS, non tender, non distended, no guarding  Extremities: No edema, No lymphangitis, No petechiae, No rashes, no synovitis:  Right breast outer right lower portion with some erythema and induration--no crepitance, no lymphangitis  Data Reviewed: Basic Metabolic Panel:  Recent Labs  Lab 09/29/12 1120 09/29/12 2038 09/30/12 0610  NA 136  --  137  K 4.1  --  3.7  CL 99  --  106  CO2 23  --  22  GLUCOSE 116*  --  121*  BUN 9  --  7  CREATININE 0.87 0.73 0.75  CALCIUM 9.1  --  8.0*   Liver Function Tests:  Recent Labs Lab  09/29/12 1120  AST 17  ALT 15  ALKPHOS 119*  BILITOT 0.4  PROT 7.2  ALBUMIN 3.9   No results found for this basename: LIPASE, AMYLASE,  in the last 168 hours No results found for this basename: AMMONIA,  in the last 168 hours CBC:  Recent Labs Lab 09/29/12 1120 09/29/12 2038 09/30/12 0610 10/02/12 0540  WBC 11.2* 9.5 6.6 6.3  NEUTROABS 8.6*  --   --  3.4  HGB 12.4 11.4* 10.3* 10.0*  HCT 37.1 35.1* 31.8* 30.4*  MCV 84.9 85.6 87.1 84.4  PLT 273 230 217 252   Cardiac Enzymes: No results found for this basename: CKTOTAL, CKMB, CKMBINDEX, TROPONINI,  in the last 168 hours BNP: No components found with this basename: POCBNP,  CBG: No results found for this basename: GLUCAP,  in the last 168 hours  Recent Results (from the past 240 hour(s))  CULTURE, BLOOD (ROUTINE X 2)     Status: None   Collection Time    09/29/12 12:30 PM      Result Value Range Status   Specimen Description BLOOD ARM RIGHT   Final   Special Requests BOTTLES DRAWN AEROBIC ONLY 6CC   Final   Culture  Setup Time 09/29/2012 17:11   Final   Culture     Final   Value:        BLOOD CULTURE RECEIVED NO GROWTH TO DATE CULTURE WILL BE HELD FOR 5 DAYS BEFORE ISSUING A FINAL NEGATIVE REPORT   Report Status PENDING   Incomplete  CULTURE, BLOOD (ROUTINE X 2)     Status: None   Collection Time    09/29/12 12:36 PM      Result Value Range Status   Specimen Description BLOOD ARM LEFT   Final   Special Requests BOTTLES DRAWN AEROBIC ONLY 5CC   Final   Culture  Setup Time 09/29/2012 17:12   Final   Culture     Final   Value:        BLOOD CULTURE RECEIVED NO GROWTH TO DATE CULTURE WILL BE HELD FOR 5 DAYS BEFORE ISSUING A FINAL NEGATIVE REPORT   Report Status PENDING   Incomplete  URINE CULTURE     Status: None   Collection Time    09/29/12 12:44 PM      Result Value Range Status   Specimen Description URINE, CLEAN CATCH   Final   Special Requests NONE   Final   Culture  Setup Time 09/29/2012 17:27   Final    Colony Count 2,000 COLONIES/ML   Final   Culture INSIGNIFICANT GROWTH   Final   Report Status 09/30/2012 FINAL   Final     Scheduled Meds: . aspirin EC  81 mg Oral Daily  . docusate sodium  100 mg Oral BID  . enoxaparin (LOVENOX) injection  40 mg Subcutaneous Q24H  . levothyroxine  88 mcg Oral QAC breakfast  . pantoprazole  40 mg Oral Daily  . sodium chloride  3 mL Intravenous Q12H  . vancomycin  1,000 mg Intravenous Q12H   Continuous Infusions: .  sodium chloride 1,000 mL (09/29/12 1253)  . sodium chloride 50 mL/hr at 09/30/12 1831     Ronte Parker, DO  [facility] Pager [phone]  If 7PM-7AM, please contact night-coverage [email or web address] Password [email or web address] 10/02/2012, 1:59 PM   LOS: 3 days

## 2012-10-03 LAB — CBC
MCHC: 33.1 g/dL (ref 30.0–36.0)
Platelets: 291 10*3/uL (ref 150–400)
RDW: 12.4 % (ref 11.5–15.5)
WBC: 6.2 10*3/uL (ref 4.0–10.5)

## 2012-10-03 MED ORDER — OXYCODONE HCL 5 MG PO TABS: 5.0000 mg | ORAL_TABLET | ORAL | Status: DC | PRN

## 2012-10-03 MED ORDER — DOXYCYCLINE HYCLATE 100 MG PO TABS: 100.0000 mg | ORAL_TABLET | Freq: Two times a day (BID) | ORAL | Status: DC

## 2012-10-03 MED ORDER — ONDANSETRON HCL 4 MG PO TABS: 4.0000 mg | ORAL_TABLET | Freq: Three times a day (TID) | ORAL | Status: DC | PRN

## 2012-10-03 MED ORDER — DOXYCYCLINE HYCLATE 100 MG PO TABS
100.0000 mg | ORAL_TABLET | Freq: Two times a day (BID) | ORAL | Status: DC
  Administered 2012-10-03: 100 mg via ORAL
  Filled 2012-10-03 (×2): qty 1

## 2012-10-03 NOTE — Progress Notes (Signed)
Patient discharge Home per Md order.  Discharge instructions reviewed with patient and family.  Copies of all forms given and explained. Patient/family voiced understanding of all instructions.  Discharge in no acute distress. Gloria Atkins B. Dvora Buitron, RN BC, BSN, MSN 

## 2012-10-03 NOTE — Discharge Summary (Addendum)
Physician Discharge Summary  Gloria Atkins ZOX:[id number] DOB: [birth year] DOA: 09/29/2012  PCP: Pamelia Hoit, MD  Admit date: 09/29/2012 Discharge date: 10/03/2012  Recommendations for Outpatient Follow-up:  1. Pt will need to follow up with PCP in 2 weeks post discharge 2. Please obtain BMP to evaluate electrolytes and kidney function 3. Please also check CBC to evaluate Hg and Hct levels 4. Follow up with Dr. Kelly Splinter, Plastic Surgery in 2 weeks  Discharge Diagnoses:  Principal Problem:   Cellulitis of breast Active Problems:   GERD   Hypothyroidism Cellulitis of the right breast  -Exam reveals regressing erythema, slight improvement in edema  -Right lower quadrant area tenderness likely due to patient's of dependent edema of the breast  -I have asked the patient to wear a brassiere today to see if this helps the patient's edema and pain in the right lower quadrant of the breast  -Continue empiric vancomycin  -Patient will be discharged home with doxycycline 100 mg twice a day for 10 additional days. This will complete 14 days of antibiotic therapy. 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 She has a history of hypothyroidism, arrhythmia, GERD, and diastolic dysfunction who had elective bilateral breast reduction surgery on 08/12/2012. She states that immediately after the surgery  she developed a knot over her nipple that drains a small amount of clear brownish fluid. Post op she was on antibiotics for 1 week and none since. On 5/27 she developed a fever (up to 104) headache and chills. She has also had a sinus infection recently and attributed the HA and fever to that. On the morning of admission, she noticed that her right breast had become red, swollen and was draining more than usual. The ED has contacted Dr. Kelly Splinter and [facility] was asked to admit. The patient was empirically started on vancomycin and Zosyn. On the day of admission, The patient was noted to have WBC 11.2, procalcitonin 0.13, lactic acid 1.44. Blood cultures were drawn and have remained negative. Ultrasound of the right breast was ordered and was negative for abscess/fluid collection. The patient was started on oxycodone for pain. Urinalysis did not show any pyuria. After approximately 48 hours, Zosyn was discontinued. The patient was continued on vancomycin. The erythema on her right breast gradually regressed. The patient did have an area of the dependent edema on the right lower quadrant of her right breast. This area was slow to improve with regard to erythema and pain. However, the patient's WBCs decreased and she remained afebrile. The patient was asked to wear a brassiere to help with the dependent edema.     Consultants:  plastic surgery, Dr. Kelly Splinter   Discharge Exam: Filed Vitals:   10/03/12 1355  BP: 132/72  Pulse: 77  Temp: 98.5 F (36.9 C)  Resp: 20   Filed Vitals:   10/02/12 2035 10/03/12 0555 10/03/12 0835 10/03/12 1355  BP: 118/64 121/63  125/69 132/72  Pulse: 84 76 80 77  Temp: 98.9 F (37.2 C) 98.9 F (37.2 C) 98.7 F (37.1 C) 98.5 F (36.9 C)  TempSrc: Oral Oral Oral Oral  Resp: 18 18 20 20   Height: 5\' 2"  (1.575 m)     Weight: 71.81 kg (158 lb 5 oz)     SpO2: 96% 100% 100% 100%   General: A&O x 3, NAD, pleasant, cooperative Cardiovascular: RRR, no rub, no gallop, no  S3 Respiratory: CTAB, no wheeze, no rhonchi Abdomen:soft, nontender, nondistended, positive bowel sounds Extremities: No edema, No lymphangitis, no petechiae; right breast with moderate erythema on the inferior third. There is some induration and erythema the right lower quadrant without any crepitance. No open or draining wounds. No necrosis.  Discharge Instructions      Discharge Orders   Future Orders Complete By Expires     Diet - low sodium heart healthy  As directed     Increase activity slowly  As directed         Medication List    TAKE these medications       aspirin EC 81 MG tablet  Take 81 mg by mouth daily.     doxycycline 100 MG tablet  Commonly known as:  VIBRA-TABS  Take 1 tablet (100 mg total) by mouth 2 (two) times daily.     levothyroxine 88 MCG tablet  Commonly known as:  SYNTHROID, LEVOTHROID  Take 88 mcg by mouth daily.     loratadine 10 MG tablet  Commonly known as:  CLARITIN  Take 10 mg by mouth daily as needed (Sinuses).     omeprazole 20 MG capsule  Commonly known as:  PRILOSEC  Take 1 capsule (20 mg total) by mouth daily.     ondansetron 4 MG tablet  Commonly known as:  ZOFRAN  Take 1 tablet (4 mg total) by mouth every 8 (eight) hours as needed for nausea.     oxyCODONE 5 MG immediate release tablet  Commonly known as:  Oxy IR/ROXICODONE  Take 1 tablet (5 mg total) by mouth every 4 (four) hours as needed.         The results of significant diagnostics from this hospitalization (including imaging, microbiology, ancillary and laboratory) are listed below for reference.    Significant Diagnostic Studies: Dg Chest 2 View  09/29/2012   *RADIOLOGY REPORT*  Clinical Data: Fever.  CHEST - 2 VIEW  Comparison: 05/11/2008  Findings: Minimal scarring/atelectasis present in the right lower lung.  There is no evidence of focal airspace consolidation, edema or pleural fluid.  Heart size is normal.  Visualized bony structures are unremarkable.   IMPRESSION: No active disease.   Original Report Authenticated By: Irish Lack, M.D.   US Breast Right  09/29/2012   *RADIOLOGY REPORT*  Clinical data:  Recent breast reduction surgery, cellulitis, discharge, swelling, question breast abscess  ULTRASOUND RIGHT BREAST:  Technique:  Sonography of the right breast was performed at the area of clinical concern at approximately the 3 o'clock position, at the site of redness and symptoms.  Findings: Skin edema is identified at the site of clinical concern. No focal solid or cystic mass is sonographically evident. No shadowing calcifications or abnormal fluid collections are identified. Unremarkable breast tissue and fat are visualized.  IMPRESSION: Skin thickening/edema at site of clinical concern. Otherwise negative ultrasound of the right breast, without identification of a mass, abscess or hematoma. Clinical management recommended. Follow-up non-emergent outpatient mammography recommended if symptoms do not completely  resolve.   Original Report Authenticated By: Ulyses Southward, M.D.     Microbiology: Recent Results (from the past 240 hour(s))  CULTURE, BLOOD (ROUTINE X 2)     Status: None   Collection Time    09/29/12 12:30 PM      Result Value Range Status   Specimen Description BLOOD ARM RIGHT   Final   Special Requests BOTTLES DRAWN AEROBIC ONLY 6CC   Final   Culture  Setup Time 09/29/2012 17:11   Final   Culture     Final   Value:        BLOOD CULTURE RECEIVED NO GROWTH TO DATE CULTURE WILL BE HELD FOR 5 DAYS BEFORE ISSUING A FINAL NEGATIVE REPORT   Report Status PENDING   Incomplete  CULTURE, BLOOD (ROUTINE X 2)     Status: None   Collection Time    09/29/12 12:36 PM      Result Value Range Status   Specimen Description BLOOD ARM LEFT   Final   Special Requests BOTTLES DRAWN AEROBIC ONLY 5CC   Final   Culture  Setup Time 09/29/2012 17:12   Final   Culture     Final   Value:        BLOOD CULTURE RECEIVED NO GROWTH TO DATE CULTURE WILL BE  HELD FOR 5 DAYS BEFORE ISSUING A FINAL NEGATIVE REPORT   Report Status PENDING   Incomplete  URINE CULTURE     Status: None   Collection Time    09/29/12 12:44 PM      Result Value Range Status   Specimen Description URINE, CLEAN CATCH   Final   Special Requests NONE   Final   Culture  Setup Time 09/29/2012 17:27   Final   Colony Count 2,000 COLONIES/ML   Final   Culture INSIGNIFICANT GROWTH   Final   Report Status 09/30/2012 FINAL   Final     Labs: Basic Metabolic Panel:  Recent Labs Lab 09/29/12 1120 09/29/12 2038 09/30/12 0610  NA 136  --  137  K 4.1  --  3.7  CL 99  --  106  CO2 23  --  22  GLUCOSE 116*  --  121*  BUN 9  --  7  CREATININE 0.87 0.73 0.75  CALCIUM 9.1  --  8.0*   Liver Function Tests:  Recent Labs Lab 09/29/12 1120  AST 17  ALT 15  ALKPHOS 119*  BILITOT 0.4  PROT 7.2  ALBUMIN 3.9   No results found for this basename: LIPASE, AMYLASE,  in the last 168 hours No results found for this basename: AMMONIA,  in the last 168 hours CBC:  Recent Labs Lab 09/29/12 1120 09/29/12 2038 09/30/12 0610 10/02/12 0540 10/03/12 0430  WBC 11.2* 9.5 6.6 6.3 6.2  NEUTROABS 8.6*  --   --  3.4  --   HGB 12.4 11.4* 10.3* 10.0* 10.2*  HCT 37.1 35.1* 31.8* 30.4* 30.8*  MCV 84.9 85.6 87.1 84.4 83.5  PLT 273 230 217 252 291   Cardiac Enzymes: No results found for this basename: CKTOTAL, CKMB, CKMBINDEX, TROPONINI,  in the last 168 hours BNP: No components found with this basename: POCBNP,  CBG: No results found for this basename: GLUCAP,  in the last 168 hours  Time coordinating discharge:  Greater than 30 minutes  Signed:  Martrell Eguia, DO [facility] Pager: [phone] 10/03/2012, 5:24 PM

## 2012-10-05 LAB — CULTURE, BLOOD (ROUTINE X 2): Culture: NO GROWTH

## 2014-05-05 HISTORY — PX: BREAST REDUCTION SURGERY: SHX8

## 2014-06-29 ENCOUNTER — Ambulatory Visit: Payer: Medicare Other | Admitting: Podiatry

## 2014-07-10 ENCOUNTER — Ambulatory Visit (INDEPENDENT_AMBULATORY_CARE_PROVIDER_SITE_OTHER): Payer: Medicare Other

## 2014-07-10 ENCOUNTER — Ambulatory Visit (INDEPENDENT_AMBULATORY_CARE_PROVIDER_SITE_OTHER): Payer: Medicare Other | Admitting: Podiatry

## 2014-07-10 ENCOUNTER — Encounter: Payer: Self-pay | Admitting: Podiatry

## 2014-07-10 VITALS — BP 128/68 | HR 74 | Resp 12

## 2014-07-10 DIAGNOSIS — M779 Enthesopathy, unspecified: Secondary | ICD-10-CM

## 2014-07-10 DIAGNOSIS — M79672 Pain in left foot: Secondary | ICD-10-CM

## 2014-07-10 MED ORDER — TRIAMCINOLONE ACETONIDE 10 MG/ML IJ SUSP
10.0000 mg | Freq: Once | INTRAMUSCULAR | Status: AC
  Administered 2014-07-10: 10 mg
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
## 2014-07-10 NOTE — Progress Notes (Signed)
Subjective:     Patient ID: Gloria Atkins, female   DOB: [birth year], 69 y.o.   MRN: [id number]  HPI patient presents stating she has developed a lot of pain in her left ankle and does not remember specific injury   Review of Systems     Objective:   Physical Exam Neurovascular status intact muscle strength adequate with discomfort in the left ankle on the lateral side with inflammation and fluid buildup noted around the sinus tarsi and mild restriction of motion when I inverted and everted her foot but no crepitus within the joint was noted    Assessment:     Probable inflammation sinus tarsi left with probable inversion injury    Plan:     Reviewed condition and discussed the injury that she experienced in the probable sprain of the ankle and today injected the sinus tarsi left 3 mg Kenalog 5 g Xylocaine and dispensed fascial brace to stop inversion eversion with instructions on usage

## 2014-07-25 ENCOUNTER — Encounter: Payer: Self-pay | Admitting: Podiatry

## 2014-07-25 ENCOUNTER — Ambulatory Visit (INDEPENDENT_AMBULATORY_CARE_PROVIDER_SITE_OTHER): Payer: Medicare Other | Admitting: Podiatry

## 2014-07-25 VITALS — BP 125/74 | HR 75 | Resp 15

## 2014-07-25 DIAGNOSIS — M779 Enthesopathy, unspecified: Secondary | ICD-10-CM

## 2014-07-25 MED ORDER — DICLOFENAC SODIUM 75 MG PO TBEC: 75.0000 mg | DELAYED_RELEASE_TABLET | Freq: Two times a day (BID) | ORAL | Status: DC

## 2014-07-25 MED ORDER — TRIAMCINOLONE ACETONIDE 10 MG/ML IJ SUSP
10.0000 mg | Freq: Once | INTRAMUSCULAR | Status: AC
  Administered 2014-07-25: 10 mg

## 2014-07-26 ENCOUNTER — Ambulatory Visit: Payer: Medicare Other | Admitting: Podiatry

## 2014-07-26 NOTE — Progress Notes (Signed)
Subjective:     Patient ID: Gloria Atkins, female   DOB: 1946/01/06, 68 y.o.   MRN: [id number]  HPI Asian states my left ankle is improving but still has an area that sore and makes it hard for me to walk comfortably. States that she would like to be able to get this improved from where it is now   Review of Systems     Objective:   Physical Exam Neurovascular status intact with continued discomfort in the sinus tarsi left with inflammation and fluid still noted but reduced from previous visit    Assessment:     Sinus tarsitis left still present but improved    Plan:     Advised on physical therapy supportive shoes and reinjected the sinus tarsi 3 motor Kenalog 5 mill grams Xylocaine and advised if improvement does not, we will need to consider immobilization. Reappoint if symptoms persist

## 2014-08-15 ENCOUNTER — Ambulatory Visit: Payer: Medicare Other | Admitting: Podiatry

## 2014-08-28 ENCOUNTER — Ambulatory Visit (INDEPENDENT_AMBULATORY_CARE_PROVIDER_SITE_OTHER): Payer: Medicare Other | Admitting: Podiatry

## 2014-08-28 ENCOUNTER — Encounter: Payer: Self-pay | Admitting: Podiatry

## 2014-08-28 VITALS — BP 134/74 | HR 78 | Resp 18

## 2014-08-28 DIAGNOSIS — M79672 Pain in left foot: Secondary | ICD-10-CM

## 2014-08-28 DIAGNOSIS — M779 Enthesopathy, unspecified: Secondary | ICD-10-CM | POA: Diagnosis not present

## 2014-08-28 MED ORDER — MELOXICAM 15 MG PO TABS: 15.0000 mg | ORAL_TABLET | Freq: Every day | ORAL | Status: DC

## 2014-08-28 MED ORDER — TRIAMCINOLONE ACETONIDE 10 MG/ML IJ SUSP
10.0000 mg | Freq: Once | INTRAMUSCULAR | Status: AC
  Administered 2014-08-28: 10 mg

## 2014-08-29 NOTE — Progress Notes (Signed)
Subjective:     Patient ID: Gloria Atkins, female   DOB: 08-22-1945, 69 y.o.   MRN: [id number]  HPI patient presents stating my left ankle is hurting me and I wanted to get it checked again   Review of Systems     Objective:   Physical Exam Neurovascular status intact with muscle strength adequate and range of motion subtalar midtarsal joint within normal limits. There was some splinting on the left side due to the pain in the sinus tarsi but overall the motion appears to be within normal limits. The sinus tarsi itself is sore when palpated    Assessment:     Appears to be some form of acute sinus tarsitis left that failed to respond to previous conservative treatment    Plan:     We went ahead today and immobilize this with air fracture walker to completely allowed to rest and I did reinject the sinus tarsi 3 [place] Kenalog 5 g Xylocaine and advised if improvement is not forthcoming we will need to consider MRI

## 2014-09-04 ENCOUNTER — Ambulatory Visit (INDEPENDENT_AMBULATORY_CARE_PROVIDER_SITE_OTHER): Payer: Medicare Other | Admitting: Podiatry

## 2014-09-04 ENCOUNTER — Encounter: Payer: Self-pay | Admitting: Podiatry

## 2014-09-04 VITALS — BP 118/68 | HR 75 | Resp 15

## 2014-09-04 DIAGNOSIS — M722 Plantar fascial fibromatosis: Secondary | ICD-10-CM

## 2014-09-04 DIAGNOSIS — M779 Enthesopathy, unspecified: Secondary | ICD-10-CM | POA: Diagnosis not present

## 2014-09-05 NOTE — Progress Notes (Signed)
Subjective:     Patient ID: Lorinda Creed, female   DOB: [birth year], 69 y.o.   MRN: [id number]  HPI patient states I'm still having a lot of pain in my left foot especially after sleeping all night and getting up in the morning. States it's been quite sore and making it difficult for ambulation   Review of Systems     Objective:   Physical Exam No change in neurovascular status with discomfort still noted sinus tarsi left into the posterior heel region left and within the dorsal ankle are tendinitis with inflammatory changes around the capsule and into the dorsal tendon area    Assessment:     H&P and condition discussed and at this time I recommended immobilization with night splint during the evening and trying to wear her boot as best she can along with ice therapy.    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

## 2014-09-08 ENCOUNTER — Ambulatory Visit (INDEPENDENT_AMBULATORY_CARE_PROVIDER_SITE_OTHER): Payer: Medicare Other | Admitting: Podiatry

## 2014-09-08 ENCOUNTER — Encounter: Payer: Self-pay | Admitting: Podiatry

## 2014-09-08 VITALS — BP 139/77 | HR 74 | Resp 12

## 2014-09-08 DIAGNOSIS — G5750 Tarsal tunnel syndrome, unspecified lower limb: Secondary | ICD-10-CM

## 2014-09-08 DIAGNOSIS — M25579 Pain in unspecified ankle and joints of unspecified foot: Secondary | ICD-10-CM

## 2014-09-08 DIAGNOSIS — M779 Enthesopathy, unspecified: Secondary | ICD-10-CM
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
## 2014-09-12 ENCOUNTER — Telehealth: Payer: Self-pay | Admitting: *Deleted

## 2014-09-12 NOTE — Telephone Encounter (Signed)
Patient's MRI needed authorization.  I initiated the request and answered all clinicals with [place].  MRI was authorized from 09/12/2014 - 10/27/2014.  Authorization number is [id number].  I faxed authorization to [place].

## 2014-09-15 ENCOUNTER — Ambulatory Visit
Admission: RE | Admit: 2014-09-15 | Discharge: 2014-09-15 | Disposition: A | Discharge status: Home / Self Care | Payer: Medicare Other | Source: Ambulatory Visit | Attending: Podiatry | Admitting: Podiatry

## 2014-09-15 DIAGNOSIS — M779 Enthesopathy, unspecified: Secondary | ICD-10-CM

## 2014-09-15 DIAGNOSIS — M25579 Pain in unspecified ankle and joints of unspecified foot: Secondary | ICD-10-CM

## 2014-09-21 ENCOUNTER — Ambulatory Visit (INDEPENDENT_AMBULATORY_CARE_PROVIDER_SITE_OTHER): Payer: Medicare Other | Admitting: Podiatry

## 2014-09-21 VITALS — BP 129/71 | HR 76 | Resp 11

## 2014-09-21 DIAGNOSIS — M779 Enthesopathy, unspecified: Secondary | ICD-10-CM

## 2014-09-21 DIAGNOSIS — M25579 Pain in unspecified ankle and joints of unspecified foot: Secondary | ICD-10-CM

## 2014-09-21 DIAGNOSIS — G5750 Tarsal tunnel syndrome, unspecified lower limb: Secondary | ICD-10-CM | POA: Diagnosis not present

## 2014-09-21 MED ORDER — TRIAMCINOLONE ACETONIDE 10 MG/ML IJ SUSP
10.0000 mg | Freq: Once | INTRAMUSCULAR | Status: AC
  Administered 2014-09-21: 10 mg

## 2014-09-22 NOTE — Progress Notes (Signed)
Subjective:     Patient ID: Gloria Atkins, female   DOB: 1945/08/26, 69 y.o.   MRN: [id number]  HPI patient presents stating I am still getting pain when I wear I wear the Tri-Lock ankle brace my ankle seems a little bit better   Review of Systems     Objective:   Physical Exam Neurovascular status unchanged with continued discomfort in the sinus tarsi left with inflammation noted and negative MRI which I reviewed with her extensively today    Assessment:     Possibility for chronic sinus tarsitis left    Plan:     I did a final injection of the sinus tarsi today 3 mg Kenalog 5 mill grams Xylocaine and I advised wearing the Tri-Lock ankle brace extensively for the next 4 weeks. If symptoms do not improve I think she's getting need synovectomy and I'm going to refer her to Dr. Jacqualyn Atkins for that procedure. She is to reappoint in 1 month if symptoms persist

## 2014-09-25 ENCOUNTER — Ambulatory Visit: Payer: Medicare Other | Admitting: Podiatry

## 2014-10-23 ENCOUNTER — Telehealth: Payer: Self-pay | Admitting: *Deleted

## 2014-10-23 NOTE — Telephone Encounter (Addendum)
Pt states she sees Dr. Paulla Dolly and would like him to call in an antiinflammatory medication.  I informed pt, Dr. Paulla Dolly refilled the Voltaren one more time and if she was not well she would need an appt.  Pt agreed.

## 2014-10-24 MED ORDER — DICLOFENAC SODIUM 75 MG PO TBEC: 75.0000 mg | DELAYED_RELEASE_TABLET | Freq: Two times a day (BID) | ORAL | Status: DC

## 2014-10-24 NOTE — Telephone Encounter (Signed)
voltaren

## 2014-12-28 ENCOUNTER — Encounter: Payer: Self-pay | Admitting: Podiatry

## 2014-12-28 ENCOUNTER — Ambulatory Visit (INDEPENDENT_AMBULATORY_CARE_PROVIDER_SITE_OTHER): Payer: Medicare Other | Admitting: Podiatry

## 2014-12-28 VITALS — BP 137/77 | HR 76 | Resp 16

## 2014-12-28 DIAGNOSIS — L600 Ingrowing nail: Secondary | ICD-10-CM | POA: Diagnosis not present

## 2014-12-28 MED ORDER — NEOMYCIN-POLYMYXIN-HC 1 % OT SOLN: OTIC | Status: DC

## 2014-12-28 NOTE — Patient Instructions (Addendum)

## 2014-12-29 NOTE — Progress Notes (Signed)
She presents today with chief complaint of a toenail that has come loose and fallen off she states that there is a piece of it that is still sticking up and is very painful.   Objective: Vital signs are stable she is alert and oriented 3 pulses are palpable bilateral. Neurologic sensorium is intact per Semmes-Weinstein monofilament deep tendon reflexes are intact bilateral. Muscle strength is equal bilateral. Orthopedic evaluation shows all joints distal to the ankle for range of motion without crepitation. Cutaneous evaluation demonstrates supple well-hydrated cutis with exception of the hallux nail plate left. It appears that the nail plate itself has been avulsed however there is a very thin layer associated with the bed of the toenail that is loose and very painful. There is  Mild paronychia and erythema.  No drainage.  Assessment: Paronychia hallux left.  Plan: At this point nail avulsion was performed after local and aesthetic was administered. She tolerated procedure well. A prescription was provided for Cortisporin Otic to be applied twice daily after soaking. She will soak twice daily Betadine in warm water follow-up with me in 1 week.

## 2015-01-04 ENCOUNTER — Ambulatory Visit: Payer: Medicare Other | Admitting: Podiatry

## 2015-10-04 ENCOUNTER — Other Ambulatory Visit: Payer: Self-pay | Admitting: Family Medicine

## 2015-10-04 DIAGNOSIS — S299XXD Unspecified injury of thorax, subsequent encounter: Secondary | ICD-10-CM

## 2015-10-04 DIAGNOSIS — I5189 Other ill-defined heart diseases: Secondary | ICD-10-CM | POA: Insufficient documentation

## 2015-10-04 DIAGNOSIS — M8580 Other specified disorders of bone density and structure, unspecified site: Secondary | ICD-10-CM | POA: Insufficient documentation

## 2015-10-04 DIAGNOSIS — R0602 Shortness of breath: Secondary | ICD-10-CM | POA: Insufficient documentation

## 2015-10-04 DIAGNOSIS — M25569 Pain in unspecified knee: Secondary | ICD-10-CM | POA: Insufficient documentation

## 2015-10-05 ENCOUNTER — Ambulatory Visit
Admission: RE | Admit: 2015-10-05 | Discharge: 2015-10-05 | Disposition: A | Discharge status: Home / Self Care | Payer: Medicare Other | Source: Ambulatory Visit | Attending: Family Medicine | Admitting: Family Medicine

## 2015-10-05 DIAGNOSIS — S299XXD Unspecified injury of thorax, subsequent encounter: Secondary | ICD-10-CM

## 2015-10-05 MED ORDER — IOPAMIDOL (ISOVUE-300) INJECTION 61%
75.0000 mL | Freq: Once | INTRAVENOUS | Status: AC | PRN
Start: 2015-10-05 — End: 2015-10-05
  Administered 2015-10-05: 75 mL via INTRAVENOUS

## 2015-10-22 ENCOUNTER — Other Ambulatory Visit: Payer: Self-pay | Admitting: Family Medicine

## 2015-10-22 DIAGNOSIS — N83201 Unspecified ovarian cyst, right side: Secondary | ICD-10-CM

## 2015-11-21 ENCOUNTER — Ambulatory Visit
Admission: RE | Admit: 2015-11-21 | Discharge: 2015-11-21 | Disposition: A | Discharge status: Home / Self Care | Payer: Medicare Other | Source: Ambulatory Visit | Attending: Family Medicine | Admitting: Family Medicine

## 2015-11-21 ENCOUNTER — Other Ambulatory Visit: Payer: Self-pay | Admitting: Family Medicine

## 2015-11-21 DIAGNOSIS — N83201 Unspecified ovarian cyst, right side: Secondary | ICD-10-CM

## 2015-12-13 ENCOUNTER — Encounter: Payer: Self-pay | Admitting: Gynecology

## 2015-12-13 ENCOUNTER — Ambulatory Visit (INDEPENDENT_AMBULATORY_CARE_PROVIDER_SITE_OTHER): Payer: Medicare Other | Admitting: Gynecology

## 2015-12-13 VITALS — BP 124/78 | Ht 63.0 in | Wt 160.0 lb

## 2015-12-13 DIAGNOSIS — Z8742 Personal history of other diseases of the female genital tract: Secondary | ICD-10-CM

## 2015-12-13 DIAGNOSIS — Z01419 Encounter for gynecological examination (general) (routine) without abnormal findings: Secondary | ICD-10-CM

## 2015-12-13 DIAGNOSIS — M8580 Other specified disorders of bone density and structure, unspecified site: Secondary | ICD-10-CM | POA: Diagnosis not present

## 2015-12-13 NOTE — Patient Instructions (Signed)
Ovarian Cyst An ovarian cyst is a fluid-filled sac that forms on an ovary. The ovaries are small organs that produce eggs in women. Various types of cysts can form on the ovaries. Most are not cancerous. Many do not cause problems, and they often go away on their own. Some may cause symptoms and require treatment. Common types of ovarian cysts include:  Functional cysts--These cysts may occur every month during the menstrual cycle. This is normal. The cysts usually go away with the next menstrual cycle if the woman does not get pregnant. Usually, there are no symptoms with a functional cyst.  Endometrioma cysts--These cysts form from the tissue that lines the uterus. They are also called "chocolate cysts" because they become filled with blood that turns brown. This type of cyst can cause pain in the lower abdomen during intercourse and with your menstrual period.  Cystadenoma cysts--This type develops from the cells on the outside of the ovary. These cysts can get very big and cause lower abdomen pain and pain with intercourse. This type of cyst can twist on itself, cut off its blood supply, and cause severe pain. It can also easily rupture and cause a lot of pain.  Dermoid cysts--This type of cyst is sometimes found in both ovaries. These cysts may contain different kinds of body tissue, such as skin, teeth, hair, or cartilage. They usually do not cause symptoms unless they get very big.  Theca lutein cysts--These cysts occur when too much of a certain hormone (human chorionic gonadotropin) is produced and overstimulates the ovaries to produce an egg. This is most common after procedures used to assist with the conception of a baby (in vitro fertilization). CAUSES   Fertility drugs can cause a condition in which multiple large cysts are formed on the ovaries. This is called ovarian hyperstimulation syndrome.  A condition called polycystic ovary syndrome can cause hormonal imbalances that can lead to  nonfunctional ovarian cysts. SIGNS AND SYMPTOMS  Many ovarian cysts do not cause symptoms. If symptoms are present, they may include:  Pelvic pain or pressure.  Pain in the lower abdomen.  Pain during sexual intercourse.  Increasing girth (swelling) of the abdomen.  Abnormal menstrual periods.  Increasing pain with menstrual periods.  Stopping having menstrual periods without being pregnant. DIAGNOSIS  These cysts are commonly found during a routine or annual pelvic exam. Tests may be ordered to find out more about the cyst. These tests may include:  Ultrasound.  X-ray of the pelvis.  CT scan.  MRI.  Blood tests. TREATMENT  Many ovarian cysts go away on their own without treatment. Your health care provider may want to check your cyst regularly for 2-3 months to see if it changes. For women in menopause, it is particularly important to monitor a cyst closely because of the higher rate of ovarian cancer in menopausal women. When treatment is needed, it may include any of the following:  A procedure to drain the cyst (aspiration). This may be done using a long needle and ultrasound. It can also be done through a laparoscopic procedure. This involves using a thin, lighted tube with a tiny camera on the end (laparoscope) inserted through a small incision.  Surgery to remove the whole cyst. This may be done using laparoscopic surgery or an open surgery involving a larger incision in the lower abdomen.  Hormone treatment or birth control pills. These methods are sometimes used to help dissolve a cyst. HOME CARE INSTRUCTIONS   Only take over-the-counter   or prescription medicines as directed by your health care provider.  Follow up with your health care provider as directed.  Get regular pelvic exams and Pap tests. SEEK MEDICAL CARE IF:   Your periods are late, irregular, or painful, or they stop.  Your pelvic pain or abdominal pain does not go away.  Your abdomen becomes  larger or swollen.  You have pressure on your bladder or trouble emptying your bladder completely.  You have pain during sexual intercourse.  You have feelings of fullness, pressure, or discomfort in your stomach.  You lose weight for no apparent reason.  You feel generally ill.  You become constipated.  You lose your appetite.  You develop acne.  You have an increase in body and facial hair.  You are gaining weight, without changing your exercise and eating habits.  You think you are pregnant. SEEK IMMEDIATE MEDICAL CARE IF:   You have increasing abdominal pain.  You feel sick to your stomach (nauseous), and you throw up (vomit).  You develop a fever that comes on suddenly.  You have abdominal pain during a bowel movement.  Your menstrual periods become heavier than usual. MAKE SURE YOU:  Understand these instructions.  Will watch your condition.  Will get help right away if you are not doing well or get worse.   This information is not intended to replace advice given to you by your health care provider. Make sure you discuss any questions you have with your health care provider.   Document Released: 04/21/2005 Document Revised: 04/26/2013 Document Reviewed: 12/27/2012 Elsevier Interactive Patient Education 2016 Elsevier Inc.  

## 2015-12-13 NOTE — Addendum Note (Signed)
Addended by: Burnett Kanaris on: 12/13/2015 02:30 PM   Modules accepted: Orders
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
## 2015-12-17 ENCOUNTER — Telehealth: Payer: Self-pay

## 2015-12-17 NOTE — Telephone Encounter (Signed)
Will if we can confirm she had an ultrasound has been less than 2 months ago and I can review it just have her make an appointment to see me and we can discuss it at that time I did not have it available with me when she was last here then she will not need another ultrasound here.

## 2015-12-17 NOTE — Telephone Encounter (Signed)
Patient questioning why you had her schedule another u/s when she just had one a few weeks ago at [facility]. She signed release form while she was here so that we could obtain that report.

## 2015-12-18 NOTE — Telephone Encounter (Signed)
Patient informed. I told her I will keep an eye out for the report we requested from [facility] and if we don't receive it soon I will call them. She offered to go by there and I told her let's give them a few more days and I will keep her posted.

## 2015-12-19 ENCOUNTER — Other Ambulatory Visit: Payer: Medicare Other

## 2015-12-19 ENCOUNTER — Ambulatory Visit: Payer: Medicare Other | Admitting: Gynecology

## 2015-12-20 ENCOUNTER — Other Ambulatory Visit: Payer: Self-pay | Admitting: Gynecology

## 2015-12-20 DIAGNOSIS — N9489 Other specified conditions associated with female genital organs and menstrual cycle: Secondary | ICD-10-CM

## 2015-12-20 NOTE — Progress Notes (Signed)
Order(s) created erroneously. Erroneous order ID: [id number]  Order moved by: Milas Hock  Order move date/time: 12/20/2015 2:24 PM  Source Patient: [id number]  Source Contact: 12/13/2015  Destination Patient: [id number]  Destination Contact: 07/20/2012

## 2015-12-20 NOTE — Telephone Encounter (Signed)
Patient informed. ROMA blood test order placed and lab appt made for in the morning. Patient knows [name] will handling scheduling her CT scan of abd/pelvis.  Appts for u/s at end of mos cancelled.

## 2015-12-20 NOTE — Telephone Encounter (Signed)
Please inform patient that I reviewed her ultrasound she does have an 8 cm ovarian mass which is highly suspicious and the radiologist recommends an abdominal pelvic CT scan. Please schedule. I need her to come by the office for a blood test for ovarian cancer screening:ROMA-1 before I see her again. If the CT scan shows any suspicion for malignancy with a blood test is abnormal I would like to refer her to the GYN oncologist Dr. Denman George.

## 2015-12-20 NOTE — Telephone Encounter (Signed)
Dr. Moshe Salisbury- I just realized that her u/s result from [facility] in her chart under Imaging and it was ordered by Bing Matter, PA 11/21/2015.  You can review it there.   I will cancel her u/s scheduled here and just make appt for her to come talk with you about result?

## 2015-12-21 ENCOUNTER — Other Ambulatory Visit: Payer: Self-pay | Admitting: Gynecology

## 2015-12-21 ENCOUNTER — Telehealth: Payer: Self-pay | Admitting: *Deleted

## 2015-12-21 ENCOUNTER — Encounter: Payer: Self-pay | Admitting: Gynecology

## 2015-12-21 ENCOUNTER — Other Ambulatory Visit: Payer: Medicare Other

## 2015-12-21 DIAGNOSIS — N838 Other noninflammatory disorders of ovary, fallopian tube and broad ligament: Secondary | ICD-10-CM

## 2015-12-21 DIAGNOSIS — N9489 Other specified conditions associated with female genital organs and menstrual cycle: Secondary | ICD-10-CM

## 2015-12-21 NOTE — Telephone Encounter (Signed)
Appointment on 12/27/15 @ 9:30am at [facility] 1 st floor radiology, pt will come pick up contrast the day before procedure to drink day of procedure. Will need BUN and Creatine level, had ROMA done today in office per lab they can use the blood from Roma testing. All orders placed, pt aware, will call if questions

## 2015-12-21 NOTE — Telephone Encounter (Signed)
-----   Message from Ramond Craver, [place] sent at 12/20/2015  3:36 PM EDT ----- Regarding: CT scan Please inform patient that I reviewed her ultrasound she does have an 8 cm ovarian mass which is highly suspicious and the radiologist recommends an abdominal pelvic CT scan. Please schedule.  Patient informed. She knows you will schedule and call her. Thanks!

## 2015-12-22 LAB — CREATININE, SERUM
CREATININE: 0.82 mg/dL (ref 0.50–0.99)
CREATININE: 0.84 mg/dL (ref 0.50–0.99)

## 2015-12-22 LAB — BUN
BUN: 10 mg/dL (ref 7–25)
BUN: 9 mg/dL (ref 7–25)

## 2015-12-25 ENCOUNTER — Other Ambulatory Visit: Payer: Self-pay | Admitting: *Deleted

## 2015-12-25 ENCOUNTER — Other Ambulatory Visit: Payer: Medicare Other

## 2015-12-25 DIAGNOSIS — R1900 Intra-abdominal and pelvic swelling, mass and lump, unspecified site: Secondary | ICD-10-CM

## 2015-12-25 LAB — OVARIAN MALIGNANCY RISK-ROMA

## 2015-12-25 NOTE — Telephone Encounter (Signed)
Dr.Fernandez verbally told me to call the pt and tell her that the ROMA-1 test was cancelled and she needed to come back to have it redrawn also to schedule office visit with Dr.Rossi at the cancer center. All this was done, appointment with Dr.Rossi on 01/09/16 @ 9:45am,pt came today to have Roma redrawn.

## 2015-12-27 ENCOUNTER — Telehealth: Payer: Self-pay | Admitting: Gynecology

## 2015-12-27 ENCOUNTER — Ambulatory Visit (HOSPITAL_COMMUNITY)
Admission: RE | Admit: 2015-12-27 | Discharge: 2015-12-27 | Disposition: A | Discharge status: Home / Self Care | Payer: Medicare Other | Source: Ambulatory Visit | Attending: Gynecology | Admitting: Gynecology

## 2015-12-27 ENCOUNTER — Encounter (HOSPITAL_COMMUNITY): Payer: Self-pay

## 2015-12-27 DIAGNOSIS — K449 Diaphragmatic hernia without obstruction or gangrene: Secondary | ICD-10-CM | POA: Diagnosis not present

## 2015-12-27 DIAGNOSIS — K5730 Diverticulosis of large intestine without perforation or abscess without bleeding: Secondary | ICD-10-CM | POA: Diagnosis not present

## 2015-12-27 DIAGNOSIS — N838 Other noninflammatory disorders of ovary, fallopian tube and broad ligament: Secondary | ICD-10-CM

## 2015-12-27 DIAGNOSIS — N839 Noninflammatory disorder of ovary, fallopian tube and broad ligament, unspecified: Secondary | ICD-10-CM | POA: Diagnosis not present

## 2015-12-27 MED ORDER — IOPAMIDOL (ISOVUE-300) INJECTION 61%
100.0000 mL | Freq: Once | INTRAVENOUS | Status: AC | PRN
  Administered 2015-12-27: 100 mL via INTRAVENOUS

## 2015-12-27 NOTE — Telephone Encounter (Signed)
Spoke with patient today to inform her the result of her CT scan. The report has stated that this right adnexal mass has slightly increased from 2014. Patient did not recall having had a CT scan at that time but the radiologist Dr. Kris Hartmann had informed me that he had read the film's for [facility] when patient was being evaluated for hematuria. He had mentioned that the mass at that time in 2014 measures 6.8 x 3.3 x 4.5 cm simple in appearance with a septation no malignant features.  The CT scan done today demonstrated the following: IMPRESSION: 7.9 cm cystic lesion in right adnexa shows mild enlargement since 2014 CT, and has probably benign characteristics. 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 Surgical evaluation should be considered.  No evidence of metastatic disease within the abdomen or pelvis.  A ROMA-1 ovarian cancer screening blood test results are pending at time of this dictation.  Patient has a consultation scheduled with GYN oncologist in the next few weeks for surgical intervention possible staging in the event this is a tumor of low malignant potential or malignant. All this information was related to the patient.

## 2015-12-29 LAB — OVARIAN MALIGNANCY RISK-ROMA
CA125: 16 U/mL (ref <35)
HE4: 60 pmol (ref <151)
ROMA Postmenopausal: 1.42 (ref <2.77)
ROMA Premenopausal: 1.11 (ref <1.31)

## 2016-01-02 ENCOUNTER — Ambulatory Visit: Payer: Medicare Other | Admitting: Gynecology

## 2016-01-02 ENCOUNTER — Other Ambulatory Visit: Payer: Medicare Other

## 2016-01-03 ENCOUNTER — Encounter: Payer: Self-pay | Admitting: Neurology

## 2016-01-03 ENCOUNTER — Ambulatory Visit (INDEPENDENT_AMBULATORY_CARE_PROVIDER_SITE_OTHER): Payer: Medicare Other | Admitting: Neurology

## 2016-01-03 VITALS — BP 130/88 | HR 76 | Resp 20 | Ht 63.0 in | Wt 155.0 lb

## 2016-01-03 DIAGNOSIS — F0781 Postconcussional syndrome: Secondary | ICD-10-CM | POA: Diagnosis not present

## 2016-01-03 DIAGNOSIS — G3184 Mild cognitive impairment, so stated: Secondary | ICD-10-CM | POA: Insufficient documentation

## 2016-01-03 DIAGNOSIS — R519 Headache, unspecified: Secondary | ICD-10-CM | POA: Insufficient documentation

## 2016-01-03 DIAGNOSIS — F5102 Adjustment insomnia: Secondary | ICD-10-CM

## 2016-01-03 DIAGNOSIS — R51 Headache: Secondary | ICD-10-CM | POA: Diagnosis not present

## 2016-01-03 MED ORDER — TRAZODONE HCL 50 MG PO TABS: 50.0000 mg | ORAL_TABLET | Freq: Every evening | ORAL | 2 refills | Status: DC | PRN

## 2016-01-03 MED ORDER — DONEPEZIL HCL 5 MG PO TABS: ORAL_TABLET | ORAL | 0 refills | Status: DC

## 2016-01-03 NOTE — Patient Instructions (Signed)
Post-Concussion Syndrome Post-concussion syndrome is the symptoms that can occur after a head injury. These symptoms can last from weeks to months. HOME CARE   Take medicines only as told by your doctor.  Do not take aspirin.  Sleep with your head raised to help with headaches.  Avoid activities that can cause another head injury.  Do not play contact sports like football, hockey, soccer, or basketball.  Do not do other risky activities like downhill skiing, martial arts, or horseback riding until your doctor says it is okay.  Keep all follow-up visits as told by your doctor. This is important.   GET HELP IF:   You have a harder time:  Paying attention.  Focusing.  Remembering.  Learning new information.  Dealing with stress.  You need more time to complete tasks.  You are easily bothered (irritable).  You have more symptoms.   Get help if you have any of these symptoms for more than two weeks after your injury:   Long-lasting (chronic) headaches.  Dizziness.  Trouble balancing.  Feeling sick to your stomach (nauseous).  Trouble with your vision.  Noise or light bothers you more.  Depression.  Mood swings.  Feeling worried (anxious).  Easily bothered.  Memory problems.  Trouble concentrating or paying attention.  Sleep problems.  Feeling tired all of the time.   GET HELP RIGHT AWAY IF:  You feel confused.  You feel very sleepy.  You are hard to wake up.  You feel sick to your stomach.  You keep throwing up (vomiting).  You feel like you are moving when you are not (vertigo).  Your eyes move back and forth very quickly.  You start shaking (convulsing) or pass out (faint).  You have very bad headaches that do not get better with medicine.  You cannot use your arms or legs like normal.  One of the black centers of your eyes (pupils) is bigger than the other.  You have clear or bloody fluid coming from your nose or ears.  Your  problems get worse, not better. MAKE SURE YOU:  Understand these instructions.  Will watch your condition.  Will get help right away if you are not doing well or get worse.   This information is not intended to replace advice given to you by your health care provider. Make sure you discuss any questions you have with your health care provider.   Document Released: 05/29/2004 Document Revised: 05/12/2014 Document Reviewed: 07/27/2013 Elsevier Interactive Patient Education 2016 Elsevier Inc.  
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
## 2016-01-09 ENCOUNTER — Encounter: Payer: Self-pay | Admitting: Gynecologic Oncology

## 2016-01-09 ENCOUNTER — Ambulatory Visit: Payer: Medicare Other | Attending: Gynecologic Oncology | Admitting: Gynecologic Oncology

## 2016-01-09 VITALS — BP 136/65 | HR 82 | Temp 97.7°F | Resp 18 | Ht 63.0 in | Wt 159.5 lb

## 2016-01-09 DIAGNOSIS — K635 Polyp of colon: Secondary | ICD-10-CM | POA: Diagnosis not present

## 2016-01-09 DIAGNOSIS — Z800 Family history of malignant neoplasm of digestive organs: Secondary | ICD-10-CM | POA: Diagnosis not present

## 2016-01-09 DIAGNOSIS — Z8249 Family history of ischemic heart disease and other diseases of the circulatory system: Secondary | ICD-10-CM | POA: Diagnosis not present

## 2016-01-09 DIAGNOSIS — R1900 Intra-abdominal and pelvic swelling, mass and lump, unspecified site: Secondary | ICD-10-CM | POA: Diagnosis not present

## 2016-01-09 DIAGNOSIS — Z8371 Family history of colonic polyps: Secondary | ICD-10-CM | POA: Diagnosis not present

## 2016-01-09 DIAGNOSIS — K224 Dyskinesia of esophagus: Secondary | ICD-10-CM | POA: Insufficient documentation

## 2016-01-09 DIAGNOSIS — R12 Heartburn: Secondary | ICD-10-CM | POA: Diagnosis not present

## 2016-01-09 DIAGNOSIS — R002 Palpitations: Secondary | ICD-10-CM | POA: Insufficient documentation

## 2016-01-09 DIAGNOSIS — Z90710 Acquired absence of both cervix and uterus: Secondary | ICD-10-CM | POA: Insufficient documentation

## 2016-01-09 DIAGNOSIS — N83201 Unspecified ovarian cyst, right side: Secondary | ICD-10-CM | POA: Diagnosis not present

## 2016-01-09 DIAGNOSIS — M8580 Other specified disorders of bone density and structure, unspecified site: Secondary | ICD-10-CM | POA: Diagnosis not present

## 2016-01-09 DIAGNOSIS — Z888 Allergy status to other drugs, medicaments and biological substances status: Secondary | ICD-10-CM | POA: Insufficient documentation

## 2016-01-09 DIAGNOSIS — K219 Gastro-esophageal reflux disease without esophagitis: Secondary | ICD-10-CM | POA: Insufficient documentation

## 2016-01-09 DIAGNOSIS — Z7982 Long term (current) use of aspirin: Secondary | ICD-10-CM | POA: Insufficient documentation

## 2016-01-09 DIAGNOSIS — Z833 Family history of diabetes mellitus: Secondary | ICD-10-CM | POA: Insufficient documentation

## 2016-01-09 DIAGNOSIS — Z803 Family history of malignant neoplasm of breast: Secondary | ICD-10-CM | POA: Insufficient documentation
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
## 2016-01-09 NOTE — Patient Instructions (Signed)
Plan to have annual ultrasounds with Dr. Sandrea Hughs office.  Please call for any questions or concerns.

## 2016-01-11 ENCOUNTER — Telehealth: Payer: Self-pay

## 2016-01-11 NOTE — Telephone Encounter (Signed)
Opened in error

## 2016-01-18 ENCOUNTER — Ambulatory Visit
Admission: RE | Admit: 2016-01-18 | Discharge: 2016-01-18 | Disposition: A | Discharge status: Home / Self Care | Payer: Medicare Other | Source: Ambulatory Visit | Attending: Neurology | Admitting: Neurology

## 2016-01-18 DIAGNOSIS — R51 Headache: Secondary | ICD-10-CM

## 2016-01-18 DIAGNOSIS — R519 Headache, unspecified: Secondary | ICD-10-CM

## 2016-01-18 DIAGNOSIS — G3184 Mild cognitive impairment, so stated: Secondary | ICD-10-CM

## 2016-01-18 DIAGNOSIS — F0781 Postconcussional syndrome: Secondary | ICD-10-CM

## 2016-01-18 DIAGNOSIS — F5102 Adjustment insomnia: Secondary | ICD-10-CM

## 2016-01-18 MED ORDER — GADOBENATE DIMEGLUMINE 529 MG/ML IV SOLN
14.0000 mL | Freq: Once | INTRAVENOUS | Status: AC | PRN
  Administered 2016-01-18: 14 mL via INTRAVENOUS

## 2016-01-29 ENCOUNTER — Telehealth: Payer: Self-pay

## 2016-01-29 NOTE — Telephone Encounter (Signed)
-----   Message from Larey Seat, MD sent at 01/29/2016 12:42 PM EDT ----- Small vessel disease,   sinusitis was noted coincidentially. CD

## 2016-01-29 NOTE — Telephone Encounter (Signed)
LM (per DPR) with results below. Left call back number if any questions.

## 2016-03-11 ENCOUNTER — Encounter: Payer: Self-pay | Admitting: Neurology

## 2016-03-11 ENCOUNTER — Ambulatory Visit (INDEPENDENT_AMBULATORY_CARE_PROVIDER_SITE_OTHER): Payer: Medicare Other | Admitting: Neurology

## 2016-03-11 VITALS — BP 130/80 | HR 84 | Resp 20 | Ht 64.0 in | Wt 159.0 lb

## 2016-03-11 DIAGNOSIS — F4310 Post-traumatic stress disorder, unspecified: Secondary | ICD-10-CM | POA: Diagnosis not present

## 2016-03-11 DIAGNOSIS — S069X1D Unspecified intracranial injury with loss of consciousness of 30 minutes or less, subsequent encounter: Secondary | ICD-10-CM

## 2016-03-11 DIAGNOSIS — G44321 Chronic post-traumatic headache, intractable: Secondary | ICD-10-CM

## 2016-03-11 DIAGNOSIS — J0120 Acute ethmoidal sinusitis, unspecified: Secondary | ICD-10-CM

## 2016-03-11 MED ORDER — SERTRALINE HCL 25 MG PO TABS
ORAL_TABLET | ORAL | 5 refills | Status: DC
Start: 2016-03-11 — End: 2016-05-20

## 2016-03-11 MED ORDER — DIAZEPAM 5 MG PO TABS: 5.0000 mg | ORAL_TABLET | Freq: Four times a day (QID) | ORAL | 0 refills | Status: DC | PRN

## 2016-03-11 MED ORDER — DONEPEZIL HCL 10 MG PO TABS: 10.0000 mg | ORAL_TABLET | Freq: Every day | ORAL | 3 refills | Status: DC
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
## 2016-03-13 ENCOUNTER — Telehealth: Payer: Self-pay | Admitting: Neurology

## 2016-03-13 DIAGNOSIS — G44309 Post-traumatic headache, unspecified, not intractable: Secondary | ICD-10-CM

## 2016-03-13 NOTE — Telephone Encounter (Signed)
Patient calling regarding possible medication reaction. Drug: sertraline (ZOLOFT) 25 MG tablet Start: 03/12/16 Symptoms: not sleeping, irritable, confused more than normal , just plain angry   [phone]

## 2016-03-14 NOTE — Telephone Encounter (Signed)
Spoke to patient, asked her to break Sertraline in half and take this half tab in AM, not at night. She understood and agreed. Will let us know on Tuesday. CD

## 2016-03-14 NOTE — Telephone Encounter (Signed)
Placed order to see Dr Jaynee Eagles for headaches. CD

## 2016-03-20 ENCOUNTER — Ambulatory Visit (INDEPENDENT_AMBULATORY_CARE_PROVIDER_SITE_OTHER): Payer: Medicare Other | Admitting: Neurology

## 2016-03-20 ENCOUNTER — Encounter: Payer: Self-pay | Admitting: Neurology

## 2016-03-20 VITALS — BP 136/80 | HR 85 | Ht 64.0 in | Wt 161.2 lb

## 2016-03-20 DIAGNOSIS — G43711 Chronic migraine without aura, intractable, with status migrainosus: Secondary | ICD-10-CM

## 2016-03-20 MED ORDER — DIVALPROEX SODIUM ER 250 MG PO TB24: 500.0000 mg | ORAL_TABLET | Freq: Every day | ORAL | 11 refills | Status: DC

## 2016-03-20 NOTE — Patient Instructions (Addendum)
Remember to drink plenty of fluid, eat healthy meals and do not skip any meals. Try to eat protein with a every meal and eat a healthy snack such as fruit or nuts in between meals. Try to keep a regular sleep-wake schedule and try to exercise daily, particularly in the form of walking, 20-30 minutes a day, if you can.   As far as your medications are concerned, I would like to suggest; Depakote 250mg . Start with one pill at night for one weke then increase to two pills.  As far as diagnostic testing: Lab  As far as your medications are concerned, I would like to suggest: Riboflavin 400 mg/day (Vitamin B2) (every day) Magnesium 400-600 mg (trigmagnesium dicitrate) daily (every day) - watch for diarrhea   I would like to see you back in 8 weeks, sooner if we need to. Please call us with any interim questions, concerns, problems, updates or refill requests.   Our phone number is [phone]. We also have an after hours call service for urgent matters and there is a physician on-call for urgent questions. For any emergencies you know to call 911 or go to the nearest emergency room  Valproic Acid, Divalproex Sodium delayed or extended-release tablets What is this medicine? DIVALPROEX SODIUM (dye VAL pro ex SO dee um) is used to prevent seizures caused by some forms of epilepsy. It is also used to treat bipolar mania and to prevent migraine headaches. This medicine may be used for other purposes; ask your health care provider or pharmacist if you have questions. COMMON BRAND NAME(S): Depakote, Depakote ER What should I tell my health care provider before I take this medicine? They need to know if you have any of these conditions: -if you often drink alcohol -kidney disease -liver disease -low platelet counts -mitochondrial disease -suicidal thoughts, plans, or attempt; a previous suicide attempt by you or a family member -urea cycle disorder (UCD) -an unusual or allergic reaction to  divalproex sodium, sodium valproate, valproic acid, other medicines, foods, dyes, or preservatives -pregnant or trying to get pregnant -breast-feeding How should I use this medicine? Take this medicine by mouth with a drink of water. Follow the directions on the prescription label. Do not cut, crush or chew this medicine. You can take it with or without food. If it upsets your stomach, take it with food. Take your medicine at regular intervals. Do not take it more often than directed. Do not stop taking except on your doctor's advice. A special MedGuide will be given to you by the pharmacist with each prescription and refill. Be sure to read this information carefully each time. Talk to your pediatrician regarding the use of this medicine in children. While this drug may be prescribed for children as young as 10 years for selected conditions, precautions do apply. Overdosage: If you think you have taken too much of this medicine contact a poison control center or emergency room at once. NOTE: This medicine is only for you. Do not share this medicine with others. What if I miss a dose? If you miss a dose, take it as soon as you can. If it is almost time for your next dose, take only that dose. Do not take double or extra doses. What may interact with this medicine? Do not take this medicine with any of the following medications: -sodium phenylbutyrate This medicine may also interact with the following medications: -aspirin -certain antibiotics like ertapenem, imipenem, meropenem -certain medicines for depression, anxiety, or psychotic disturbances -  certain medicines for seizures like carbamazepine, clonazepam, diazepam, ethosuximide, felbamate, lamotrigine, phenobarbital, phenytoin, primidone, rufinamide, topiramate -certain medicines that treat or prevent blood clots like warfarin -cholestyramine -female hormones, like estrogens and birth control pills, patches, or  rings -propofol -rifampin -ritonavir -tolbutamide -zidovudine This list may not describe all possible interactions. Give your health care provider a list of all the medicines, herbs, non-prescription drugs, or dietary supplements you use. Also tell them if you smoke, drink alcohol, or use illegal drugs. Some items may interact with your medicine. What should I watch for while using this medicine? Tell your doctor or healthcare professional if your symptoms do not get better or they start to get worse. Wear a medical ID bracelet or chain, and carry a card that describes your disease and details of your medicine and dosage times. You may get drowsy, dizzy, or have blurred vision. Do not drive, use machinery, or do anything that needs mental alertness until you know how this medicine affects you. To reduce dizzy or fainting spells, do not sit or stand up quickly, especially if you are an older patient. Alcohol can increase drowsiness and dizziness. Avoid alcoholic drinks. This medicine can make you more sensitive to the sun. Keep out of the sun. If you cannot avoid being in the sun, wear protective clothing and use sunscreen. Do not use sun lamps or tanning beds/booths. Patients and their families should watch out for new or worsening depression or thoughts of suicide. Also watch out for sudden changes in feelings such as feeling anxious, agitated, panicky, irritable, hostile, aggressive, impulsive, severely restless, overly excited and hyperactive, or not being able to sleep. If this happens, especially at the beginning of treatment or after a change in dose, call your health care professional. Women should inform their doctor if they wish to become pregnant or think they might be pregnant. There is a potential for serious side effects to an unborn child. Talk to your health care professional or pharmacist for more information. Women who become pregnant while using this medicine may enroll in the [place] Pregnancy Registry by calling [phone]. This registry collects information about the safety of antiepileptic drug use during pregnancy. What side effects may I notice from receiving this medicine? Side effects that you should report to your doctor or health care professional as soon as possible: -allergic reactions like skin rash, itching or hives, swelling of the face, lips, or tongue -changes in vision -redness, blistering, peeling or loosening of the skin, including inside the mouth -signs and symptoms of liver injury like dark yellow or brown urine; general ill feeling or flu-like symptoms; light-colored stools; loss of appetite; nausea; right upper belly pain; unusually weak or tired; yellowing of the eyes or skin -suicidal thoughts or other mood changes -unusual bleeding or bruising Side effects that usually do not require medical attention (report to your doctor or health care professional if they continue or are bothersome): -constipation -diarrhea -dizziness -hair loss -headache -loss of appetite -weight gain This list may not describe all possible side effects. Call your doctor for medical advice about side effects. You may report side effects to FDA at 1-800-FDA-1088. Where should I keep my medicine? Keep out of reach of children. Store at room temperature between 15 and 30 degrees C (59 and 86 degrees F). Keep container tightly closed. Throw away any unused medicine after the expiration date. NOTE: This sheet is a summary. It may not cover all possible information. If you have questions  about this medicine, talk to your doctor, pharmacist, or health care provider.  2017 Elsevier/Gold Standard (2015-07-26 07:11:40)

## 2016-03-20 NOTE — Progress Notes (Signed)
[facility]    Provider:  Dr Jaynee Eagles Referring Provider: Dohmeier, Asencion Partridge, MD Primary Care Physician:  Woody Seller, MD  CC:  Intractable headache  HPI:  Gloria Atkins is a 70 y.o. female here as a referral from Dr. Brett Fairy for Intractable headache. Headaches started in May or June 2017. The headaches on the left side around the eye usually and sometimes in the back of the head. Headaches are throbbing. She has daily headaches, she has it every day and gets worse in the evenings. Worse with aggravation. She has a lot of depression. She is irritable and she can;t sleep at night. She has light sensitivity, sound bothers her, she doesn't like people around. Headache lasts all day. This has been ongoing for close to 7 months. Loud noises trigger headaches or sometime people talking. She takes aleve sometimes but no more than 2-3x a week, no medication overuse. Headaches can be 7/10 with blurry vision, some nausea never vomits. No other associated symptoms other than fatigue and insomnia. No aura.   Reviewed notes, labs and imaging from outside physicians, which showed:  Medications tried for headache: Trazodone, Sertraline,    Personally reviewed MRI brain images and agree with the following:  FINDINGS:  The brain parenchyma shows tiny scattered supratentorial periventricular and subcortical nonspecific white matter hyperintensities on FLAIR/T2 images. These are nonspecific and may be seen in the right of conditions including migraine headaches, remote trauma, small vessel disease, vasculitis, demyelinating, autoimmune or infectious conditions. None of these involve the corpus callosum, brainstem or cerebellum or show any postcontrast enhancement. No other structural lesion, tumor infarcts are noted. The subarachnoid spaces and ventricular system appear normal. Calvarium shows no abnormalities. Orbits appear unremarkable. Paranasal sinuses show mild chronic inflammatory  changes. The pituitary gland   appears to be atrophied with empty sella. Visualized portion of the upper cervical spine appears normal. Flow voids of the large vessels of the intracranial circulation appear to be patent.     IMPRESSION:  Slightly abnormal MRI scan of the brain showing tiny nonspecific supratentorial white matter hyperintensities with differential discussed above. Incidental changes of empty sella and paranasal sinusitis are noted also.   Review of Systems: Patient complains of symptoms per HPI as well as the following symptoms: No CP, no SOB. Pertinent negatives per HPI. All others negative.   Social History   Social History  . Marital status: Single    Spouse name: N/A  . Number of children: 2  . Years of education: 12   Occupational History  . CNA     [employer]   Social History Main Topics  . Smoking status: Never Smoker  . Smokeless tobacco: Never Used  . Alcohol use No  . Drug use: No  . Sexual activity: Yes   Other Topics Concern  . Not on file   Social History Narrative   Lives alone   Caffeine use: Sweet tea daily    Family History  Problem Relation Age of Onset  . Coronary artery disease Father   . Heart attack Father   . Breast cancer Sister   . Hypertension    . Diabetes    . Colon polyps    . Colon cancer Neg Hx   . Stomach cancer Neg Hx     Past Medical History:  Diagnosis Date  . Arrhythmia    SOMETIMES HER HEART WILL BEAT REALLY FAST FOR NO REASON  . Chronic knee pain   . Diastolic dysfunction   .  Diverticulosis   . Esophageal dysmotility 1/14   stretching-improved swallowing since   . GERD (gastroesophageal reflux disease)   . Heart palpitations    Negative event monitor 4/12  . Heartburn   . Hyperplastic colon polyp   . Nausea   . Osteopenia     Past Surgical History:  Procedure Laterality Date  . ABDOMINAL HYSTERECTOMY  1980  . ANAL FISSURE REPAIR    . CHOLECYSTECTOMY  1970's  . COLONOSCOPY  1/14  .  CYSTOSCOPY N/A 08/05/2012   Procedure: CYSTOSCOPY BLADDER BIOPSY ;  Surgeon: Malka So, MD;  Location: [facility];  Service: Urology;  Laterality: N/A;  . 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 HEEL SPUR SURGERY    . LIPOSUCTION    . THYROIDECTOMY Bilateral 1974    Current Outpatient Prescriptions  Medication Sig Dispense Refill  . aspirin EC 81 MG EC tablet Take 81 mg by mouth. OCCASIONALLY    . diazepam (VALIUM) 5 MG tablet Take 1 tablet (5 mg total) by mouth every 6 (six) hours as needed for anxiety. 30 tablet 0  . donepezil (ARICEPT) 10 MG tablet Take 1 tablet (10 mg total) by mouth at bedtime. 30 tablet 3  . levothyroxine (SYNTHROID, LEVOTHROID) 100 MCG tablet Take 100 mcg by mouth every other day.    . levothyroxine (SYNTHROID, LEVOTHROID) 88 MCG tablet Take 88 mcg by mouth daily.      Marland Kitchen omeprazole (PRILOSEC) 20 MG capsule Take 1 capsule (20 mg total) by mouth daily. 30 capsule 5  . oxyCODONE (OXY IR/ROXICODONE) 5 MG immediate release tablet Take 5 mg by mouth every 6 (six) hours as needed.    . sertraline (ZOLOFT) 25 MG tablet Take in AM with breakfast 30 tablet 5  . divalproex (DEPAKOTE ER) 250 MG 24 hr tablet Take 2 tablets (500 mg total) by mouth at bedtime. Start with one pill at night for 1 week then increase to two pills. 60 tablet 11   No current facility-administered medications for this visit.     Allergies as of 03/20/2016 - Review Complete 03/11/2016  Allergen Reaction Noted  . Alendronate Diarrhea 08/29/2015    Vitals: BP 136/80 (BP Location: Right Arm, Patient Position: Sitting, Cuff Size: Normal)   Pulse 85   Ht 5\' 4"  (1.626 m)   Wt 161 lb 3.2 oz (73.1 kg)   BMI 27.67 kg/m  Last Weight:  Wt Readings from Last 1 Encounters:  03/20/16 161 lb 3.2 oz (73.1 kg)   Last Height:   Ht Readings from Last 1  Encounters:  03/20/16 5\' 4"  (1.626 m)    Physical exam: Exam: Gen: NAD, conversant, well nourised, obese, well groomed                     CV: RRR, no MRG. No Carotid Bruits. No peripheral edema, warm, nontender Eyes: Conjunctivae clear without exudates or hemorrhage  Neuro: Detailed Neurologic Exam  Speech:    Speech is normal; fluent and spontaneous with normal comprehension.  Cognition:    The patient is oriented to person, place, and time;     recent and remote memory intact;     language fluent;     normal attention, concentration,     fund of knowledge Cranial Nerves:    The pupils are equal, round, and reactive  to light. The fundi are normal and spontaneous venous pulsations are present. Visual fields are full to finger confrontation. Extraocular movements are intact. Trigeminal sensation is intact and the muscles of mastication are normal. The face is symmetric. The palate elevates in the midline. Hearing intact. Voice is normal. Shoulder shrug is normal. The tongue has normal motion without fasciculations.   Motor Observation:    No asymmetry, no atrophy, and no involuntary movements noted. Tone:    Normal muscle tone.    Posture:    Posture is normal. normal erect    Strength:    Strength is V/V in the upper and lower limbs.          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   - Will start Depakote for her headaches which may also help with her mood.  - Can try serial nerve blocks, patient will email in 4-6 weeks if not improved - follow up in 8 weeks or sooner if not improved and will try nerve blocks - When headache is not daily will introduce acute management - Botox may be very beneficial in this situation, need to try several medications first in multiple classes - needs continued follow up with therapy and  psychiatry Can also try the following:  - Riboflavin 400 mg/day (Vitamin B2) (every day) - Magnesium 400-600 mg (trigmagnesium dicitrate) daily (every day) - watch for diarrhea   Discussed: Remember to drink plenty of fluid, eat healthy meals and do not skip any meals. Try to eat protein with a every meal and eat a healthy snack such as fruit or nuts in between meals. Try to keep a regular sleep-wake schedule and try to exercise daily, particularly in the form of walking, 20-30 minutes a day, if you can.   As far as your medications are concerned, I would like to suggest; Depakote 250mg . Start with one pill at night for one weke then increase to two pills.  As far as diagnostic testing: Lab  I would like to see you back in 8 weeks, sooner if we need to. Please call us with any interim questions, concerns, problems, updates or refill requests.    Cc: Dr. Brett Fairy  Sarina Ill, MD  [facility] [address] [place], [place] [address]  Phone [phone] Fax [phone]  A total of 40 minutes was spent face-to-face with this patient. Over half this time was spent on counseling patient on the intractable headache diagnosis and different diagnostic and therapeutic options available.

## 2016-03-21 LAB — CBC
HEMOGLOBIN: 12.5 g/dL (ref 11.1–15.9)
Hematocrit: 39.1 % (ref 34.0–46.6)
MCH: 27.2 pg (ref 26.6–33.0)
MCHC: 32 g/dL (ref 31.5–35.7)
MCV: 85 fL (ref 79–97)
Platelets: 351 10*3/uL (ref 150–379)
RBC: 4.59 x10E6/uL (ref 3.77–5.28)
RDW: 13.9 % (ref 12.3–15.4)
WBC: 6.4 10*3/uL (ref 3.4–10.8)

## 2016-03-21 LAB — COMPREHENSIVE METABOLIC PANEL
ALBUMIN: 4.1 g/dL (ref 3.6–4.8)
ALT: 15 IU/L (ref 0–32)
AST: 16 IU/L (ref 0–40)
Albumin/Globulin Ratio: 1.6 (ref 1.2–2.2)
Alkaline Phosphatase: 110 IU/L (ref 39–117)
BUN / CREAT RATIO: 14 (ref 12–28)
BUN: 11 mg/dL (ref 8–27)
CALCIUM: 9.5 mg/dL (ref 8.7–10.3)
CO2: 25 mmol/L (ref 18–29)
CREATININE: 0.76 mg/dL (ref 0.57–1.00)
Chloride: 102 mmol/L (ref 96–106)
GFR, EST AFRICAN AMERICAN: 93 mL/min/{1.73_m2} (ref >59)
GFR, EST NON AFRICAN AMERICAN: 80 mL/min/{1.73_m2} (ref >59)
GLUCOSE: 88 mg/dL (ref 65–99)
Globulin, Total: 2.5 g/dL (ref 1.5–4.5)
Potassium: 4.8 mmol/L (ref 3.5–5.2)
Sodium: 142 mmol/L (ref 134–144)
TOTAL PROTEIN: 6.6 g/dL (ref 6.0–8.5)

## 2016-03-21 NOTE — Progress Notes (Signed)
I appreciate  the assessment and plan as directed by my colleague, Sarina Ill, MD     Larey Seat, MD

## 2016-03-25 ENCOUNTER — Telehealth: Payer: Self-pay | Admitting: *Deleted

## 2016-03-25 NOTE — Telephone Encounter (Signed)
Per Dr Jaynee Eagles, LVM informing patient her lab results are normal. Left name, number for any questions.

## 2016-03-25 NOTE — Telephone Encounter (Signed)
Pt returned call , relayed her labs are normal. Pt expressed understanding.

## 2016-05-20 ENCOUNTER — Ambulatory Visit (INDEPENDENT_AMBULATORY_CARE_PROVIDER_SITE_OTHER): Payer: Medicare Other | Admitting: Neurology

## 2016-05-20 ENCOUNTER — Encounter: Payer: Self-pay | Admitting: Neurology

## 2016-05-20 VITALS — BP 130/79 | HR 96 | Ht 64.0 in | Wt 160.0 lb

## 2016-05-20 DIAGNOSIS — R51 Headache: Secondary | ICD-10-CM

## 2016-05-20 DIAGNOSIS — R519 Headache, unspecified: Secondary | ICD-10-CM

## 2016-05-20 NOTE — Progress Notes (Signed)
[facility]    Provider:  Dr Jaynee Eagles Referring Provider: Larey Seat, MD Primary Care Physician:  Woody Seller, MD  CC:  Intractable headache  Interval history 05/20/2016: Patient returns and feels much improved. The headaches are gone. She gets the headaches rarely and they go away on their own she doesn;t have to take anything. She has problems sleeping and takes valium 1/2 pill every other night and helps her sleep. No side effects from the Depakote. She stopped taking Zoloft because it made her mood worse. She is feeling better with her mood. She is getting out a lot and is more social.   HPI:  Gloria Atkins is a 71 y.o. female here as a referral from Dr. Brett Fairy for Intractable headache. Headaches started in May or June 2017. The headaches on the left side around the eye usually and sometimes in the back of the head. Headaches are throbbing. She has daily headaches, she has it every day and gets worse in the evenings. Worse with aggravation. She has a lot of depression. She is irritable and she can;t sleep at night. She has light sensitivity, sound bothers her, she doesn't like people around. Headache lasts all day. This has been ongoing for close to 7 months. Loud noises trigger headaches or sometime people talking. She takes aleve sometimes but no more than 2-3x a week, no medication overuse. Headaches can be 7/10 with blurry vision, some nausea never vomits. No other associated symptoms other than fatigue and insomnia. No aura.   Reviewed notes, labs and imaging from outside physicians, which showed:  Medications tried for headache: Trazodone, Sertraline,    Personally reviewed MRI brain images and agree with the following:  FINDINGS:  The brain parenchyma shows tiny scattered supratentorial periventricular and subcortical nonspecific white matter hyperintensities on FLAIR/T2 images. These are nonspecific and may be seen in the right of conditions  including migraine headaches, remote trauma, small vessel disease, vasculitis, demyelinating, autoimmune or infectious conditions. None of these involve the corpus callosum, brainstem or cerebellum or show any postcontrast enhancement. No other structural lesion, tumor infarcts are noted. The subarachnoid spaces and ventricular system appear normal. Calvarium shows no abnormalities. Orbits appear unremarkable. Paranasal sinuses show mild chronic inflammatory changes. The pituitary gland appears to be atrophied with empty sella. Visualized portion of the upper cervical spine appears normal. Flow voids of the large vessels of the intracranial circulation appear to be patent.     IMPRESSION: Slightly abnormal MRI scan of the brain showing tiny nonspecific supratentorial white matter hyperintensities with differential discussed above. Incidental changes of empty sella and paranasal sinusitis are noted also.   Review of Systems: Patient complains of symptoms per HPI as well as the following symptoms: No CP, no SOB. Pertinent negatives per HPI. All others negative.   Social History   Social History  . Marital status: Single    Spouse name: N/A  . Number of children: 2  . Years of education: 12   Occupational History  . CNA     [employer]   Social History Main Topics  . Smoking status: Never Smoker  . Smokeless tobacco: Never Used  . Alcohol use No  . Drug use: No  . Sexual activity: Yes   Other Topics Concern  . Not on file   Social History Narrative   Lives alone   Caffeine use: Sweet tea daily    Family History  Problem Relation Age of Onset  . Coronary artery disease Father   .  Heart attack Father   . Breast cancer Sister   . Hypertension    . Diabetes    . Colon polyps    . Colon cancer Neg Hx   . Stomach cancer Neg Hx     Past Medical History:  Diagnosis Date  . Arrhythmia    SOMETIMES HER HEART WILL BEAT REALLY FAST FOR NO REASON  . Chronic knee pain     . Diastolic dysfunction   . Diverticulosis   . Esophageal dysmotility 1/14   stretching-improved swallowing since   . GERD (gastroesophageal reflux disease)   . Heart palpitations    Negative event monitor 4/12  . Heartburn   . Hyperplastic colon polyp   . Nausea   . Osteopenia     Past Surgical History:  Procedure Laterality Date  . ABDOMINAL HYSTERECTOMY  1980  . ANAL FISSURE REPAIR    . CHOLECYSTECTOMY  1970's  . COLONOSCOPY  1/14  . CYSTOSCOPY N/A 08/05/2012   Procedure: CYSTOSCOPY BLADDER BIOPSY ;  Surgeon: Malka So, MD;  [facility];  Service: Urology;  Laterality: N/A;  . 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/2012   Procedure: WITH FULGURATION ;  Surgeon: Malka So, MD;  Location: [facility];  Service: Urology;  Laterality: N/A;  . HEEL SPUR SURGERY    . LIPOSUCTION    . THYROIDECTOMY Bilateral 1974    Current Outpatient Prescriptions  Medication Sig Dispense Refill  . aspirin EC 81 MG EC tablet Take 81 mg by mouth. OCCASIONALLY    . diazepam (VALIUM) 5 MG tablet Take 1 tablet (5 mg total) by mouth every 6 (six) hours as needed for anxiety. 30 tablet 0  . divalproex (DEPAKOTE ER) 250 MG 24 hr tablet Take 2 tablets (500 mg total) by mouth at bedtime. Start with one pill at night for 1 week then increase to two pills. 60 tablet 11  . levothyroxine (SYNTHROID, LEVOTHROID) 100 MCG tablet Take 100 mcg by mouth every other day.    . levothyroxine (SYNTHROID, LEVOTHROID) 88 MCG tablet Take 88 mcg by mouth daily. Takes 61mcg every other day and 12mcg the off days    . omeprazole (PRILOSEC) 20 MG capsule Take 1 capsule (20 mg total) by mouth daily. 30 capsule 5  . oxyCODONE (OXY IR/ROXICODONE) 5 MG immediate release tablet Take 5 mg by mouth every 6 (six) hours as needed.     No current facility-administered medications for this visit.     Allergies as of 05/20/2016 - Review Complete 05/20/2016  Allergen Reaction Noted  .  Alendronate Diarrhea 08/29/2015    Vitals: BP 130/79 (BP Location: Right Arm, Patient Position: Sitting, Cuff Size: Normal)   Pulse 96   Ht 5\' 4"  (1.626 m)   Wt 160 lb (72.6 kg)   BMI 27.46 kg/m  Last Weight:  Wt Readings from Last 1 Encounters:  05/20/16 160 lb (72.6 kg)   Last Height:   Ht Readings from Last 1 Encounters:  05/20/16 5\' 4"  (1.626 m)     Physical exam: Exam: Gen: NAD, conversant, well nourised, obese, well groomed                     CV: RRR, no MRG. No Carotid Bruits. No peripheral edema, warm, nontender Eyes: Conjunctivae clear without exudates or hemorrhage  Neuro: Detailed Neurologic Exam  Speech:    Speech is normal; fluent and spontaneous with normal comprehension.  Cognition:    The  patient is oriented to person, place, and time;     recent and remote memory intact;     language fluent;     normal attention, concentration,     fund of knowledge Cranial Nerves:    The pupils are equal, round, and reactive to light. The fundi are normal and spontaneous venous pulsations are present. Visual fields are full to finger confrontation. Extraocular movements are intact. Trigeminal sensation is intact and the muscles of mastication are normal. The face is symmetric. The palate elevates in the midline. Hearing intact. Voice is normal. Shoulder shrug is normal. The tongue has normal motion without fasciculations.   Motor Observation:    No asymmetry, no atrophy, and no involuntary movements noted. Tone:    Normal muscle tone.    Posture:    Posture is normal. normal erect    Strength:    Strength is V/V in the upper and lower limbs.          Assessment/Plan:  72 year old patient with intractable chromic migrainous headache much improved on Depakote.  Discussed headache management with patient, lifestyle modifications, preventative and acute management.   - Continue Depakote for her headaches which may also help with her mood. Her headaches  are very infrequent now, she is very happy and we will continue current management.   Discussed: To prevent or relieve headaches, try the following: Cool Compress. Lie down and place a cool compress on your head.  Avoid headache triggers. If certain foods or odors seem to have triggered your migraines in the past, avoid them. A headache diary might help you identify triggers.  Include physical activity in your daily routine. Try a daily walk or other moderate aerobic exercise.  Manage stress. Find healthy ways to cope with the stressors, such as delegating tasks on your to-do list.  Practice relaxation techniques. Try deep breathing, yoga, massage and visualization.  Eat regularly. Eating regularly scheduled meals and maintaining a healthy diet might help prevent headaches. Also, drink plenty of fluids.  Follow a regular sleep schedule. Sleep deprivation might contribute to headaches Consider biofeedback. With this mind-body technique, you learn to control certain bodily functions - such as muscle tension, heart rate and blood pressure - to prevent headaches or reduce headache pain.    Proceed to emergency room if you experience new or worsening symptoms or symptoms do not resolve, if you have new neurologic symptoms or if headache is severe, or for any concerning symptom.   I would like to see you back as needed. Please call us with any interim questions, concerns, problems, updates or refill requests.    Cc: Dr. Brett Fairy  Sarina Ill, MD  [facility] [address] [place], [place] [address]  Phone [phone] Fax [phone]  A total of 15 minutes was spent face-to-face with this patient. Over half this time was spent on counseling patient on the headache diagnosis and different diagnostic and therapeutic options available.

## 2016-05-20 NOTE — Progress Notes (Signed)
I agree with the assessment and plan as directed -The patient is known to me .   Shray Hunley, MD

## 2016-05-20 NOTE — Patient Instructions (Signed)
Remember to drink plenty of fluid, eat healthy meals and do not skip any meals. Try to eat protein with a every meal and eat a healthy snack such as fruit or nuts in between meals. Try to keep a regular sleep-wake schedule and try to exercise daily, particularly in the form of walking, 20-30 minutes a day, if you can.   As far as your medications are concerned, I would like to suggest: Continue Depakote  I would like to see you back as needed, sooner if we need to. Please call us with any interim questions, concerns, problems, updates or refill requests.   Our phone number is [phone]. We also have an after hours call service for urgent matters and there is a physician on-call for urgent questions. For any emergencies you know to call 911 or go to the nearest emergency room

## 2016-07-09 ENCOUNTER — Ambulatory Visit: Payer: Medicare Other | Admitting: Neurology

## 2016-09-17 ENCOUNTER — Encounter: Payer: Self-pay | Admitting: Gynecology

## 2017-07-29 ENCOUNTER — Other Ambulatory Visit: Payer: Self-pay | Admitting: Family Medicine

## 2017-07-29 ENCOUNTER — Ambulatory Visit
Admission: RE | Admit: 2017-07-29 | Discharge: 2017-07-29 | Disposition: A | Discharge status: Home / Self Care | Payer: Medicare Other | Source: Ambulatory Visit | Attending: Family Medicine | Admitting: Family Medicine

## 2017-07-29 DIAGNOSIS — R0989 Other specified symptoms and signs involving the circulatory and respiratory systems: Secondary | ICD-10-CM

## 2017-07-29 DIAGNOSIS — R05 Cough: Secondary | ICD-10-CM

## 2017-07-29 DIAGNOSIS — R059 Cough, unspecified: Secondary | ICD-10-CM

## 2017-09-09 ENCOUNTER — Other Ambulatory Visit: Payer: Self-pay | Admitting: Family Medicine

## 2017-09-09 DIAGNOSIS — E2839 Other primary ovarian failure: Secondary | ICD-10-CM

## 2017-10-16 ENCOUNTER — Other Ambulatory Visit: Payer: Medicare Other

## 2017-11-20 ENCOUNTER — Ambulatory Visit
Admission: RE | Admit: 2017-11-20 | Discharge: 2017-11-20 | Disposition: A | Discharge status: Home / Self Care | Payer: Medicare Other | Source: Ambulatory Visit | Attending: Family Medicine | Admitting: Family Medicine

## 2017-11-20 DIAGNOSIS — E2839 Other primary ovarian failure: Secondary | ICD-10-CM

## 2018-05-29 IMAGING — CR DG CHEST 2V
2 series · 2 of 2 positions shown · non-contrast
Comparison: Chest x-ray of September 29, 2012 and chest CT scan of October 05, 2015

CLINICAL DATA: Fever, cough for the past 3 days. Positive flu test,
abnormal breath sounds on exam. Nonsmoker.

EXAM:
CHEST - 2 VIEW

[w chest pa]
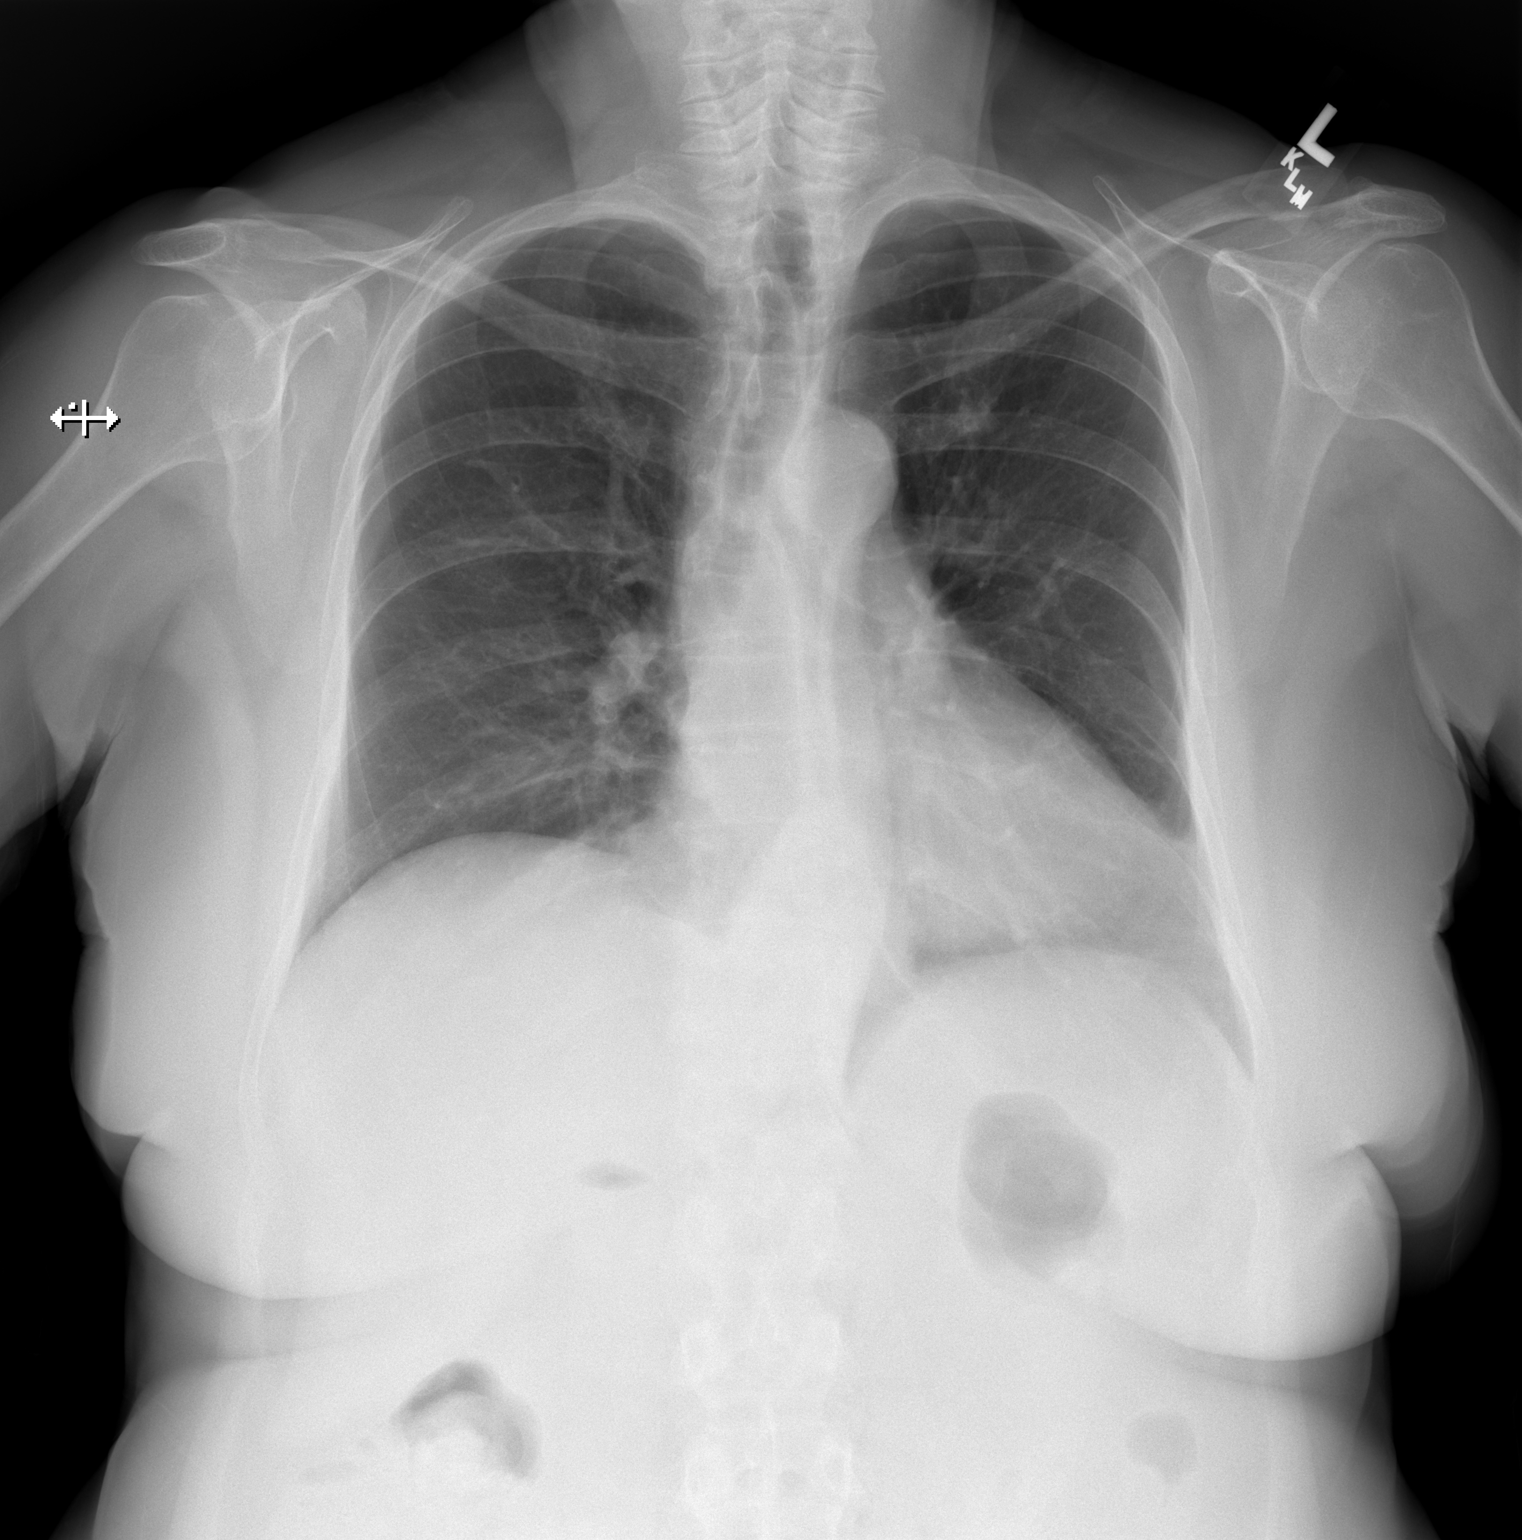

[w chest lat]
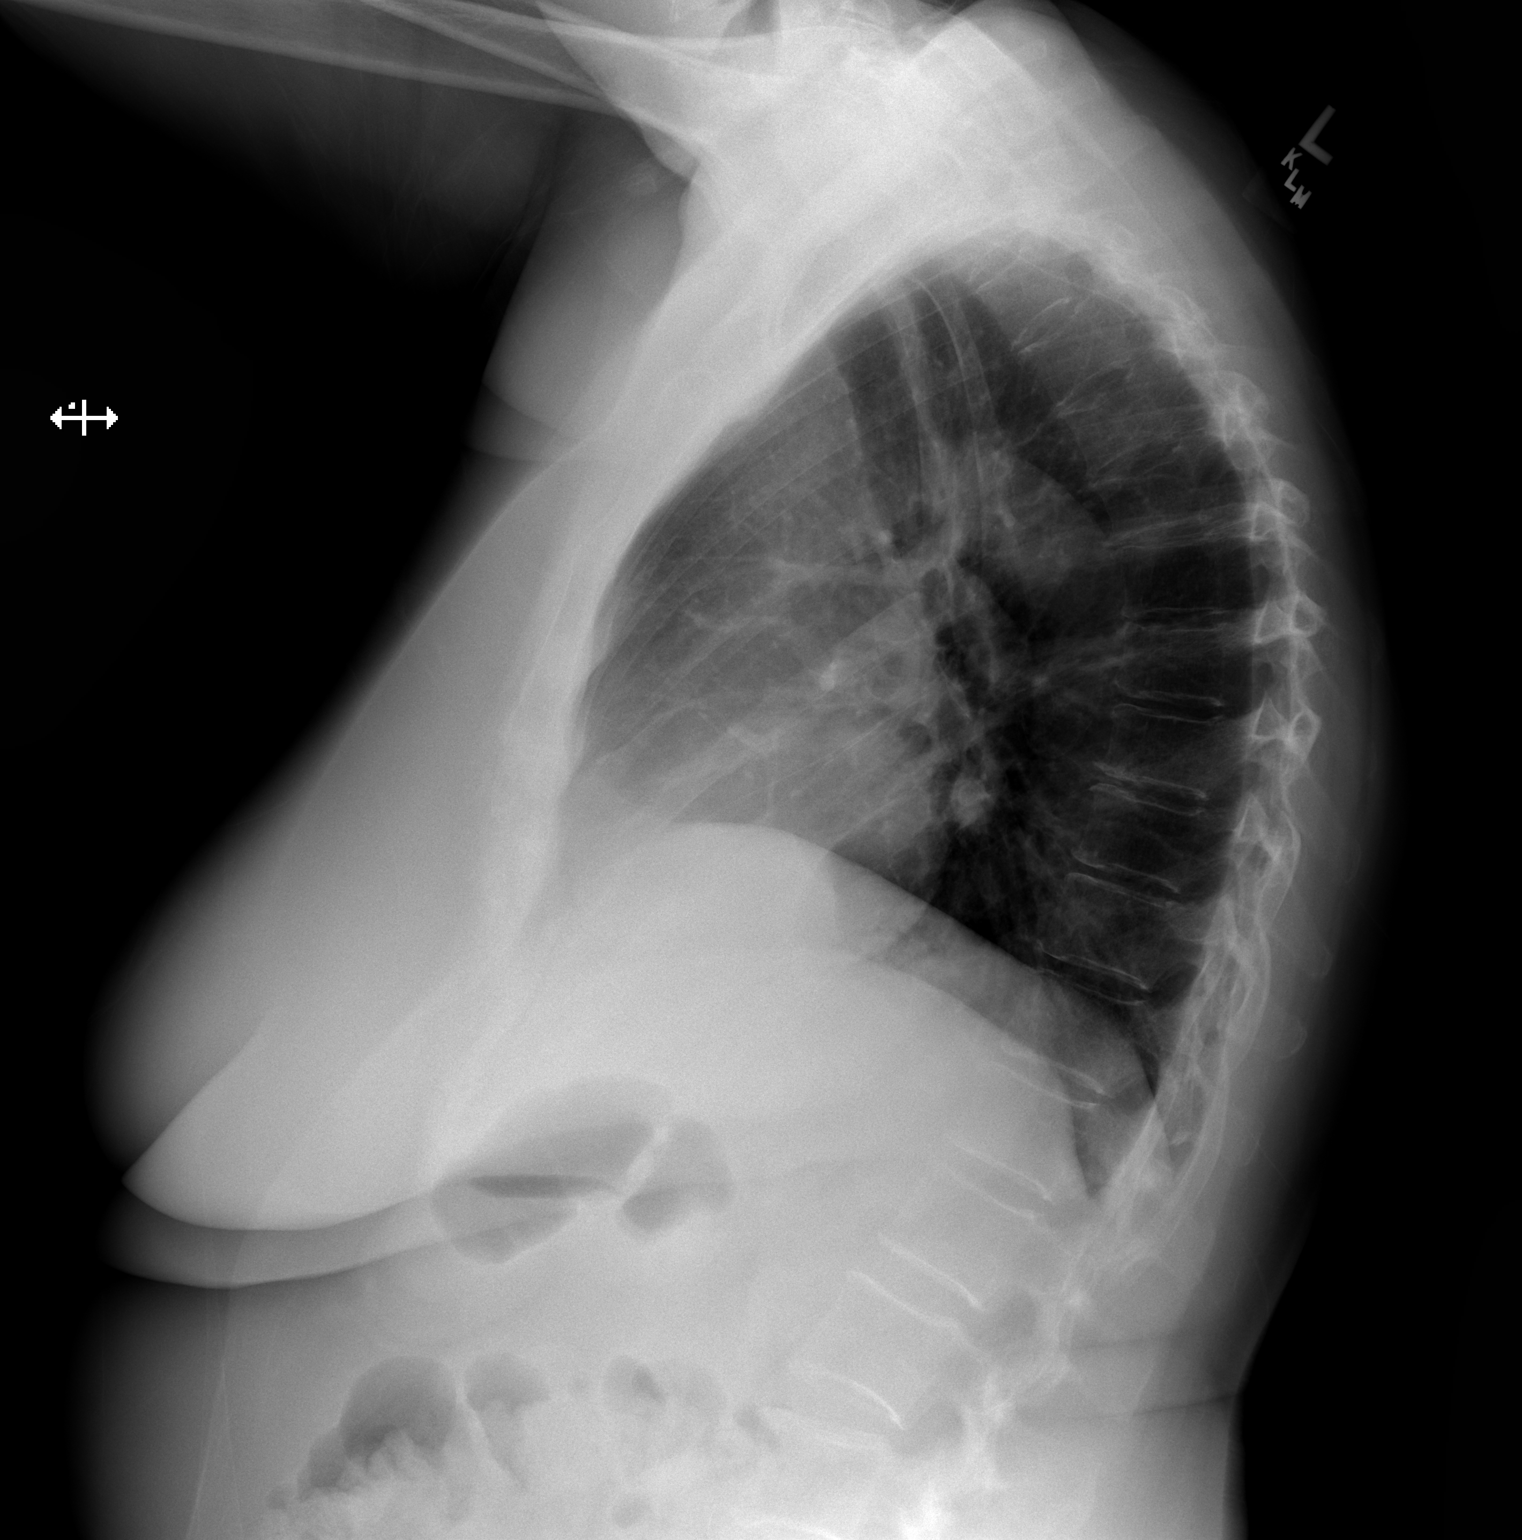

[2 of 2 positions shown; findings below may reference images not displayed]

FINDINGS: The lungs are adequately inflated. There is no focal infiltrate.
There is no pleural effusion. There is a small hiatal hernia. The
heart and pulmonary vascularity are normal. The mediastinum is
normal in width. The bony thorax exhibits no acute abnormality.
IMPRESSION: There is no pneumonia nor other acute cardiopulmonary abnormality.

## 2018-11-09 DIAGNOSIS — E538 Deficiency of other specified B group vitamins: Secondary | ICD-10-CM | POA: Insufficient documentation

## 2018-11-09 DIAGNOSIS — R7303 Prediabetes: Secondary | ICD-10-CM | POA: Insufficient documentation

## 2019-08-22 ENCOUNTER — Other Ambulatory Visit: Payer: Self-pay

## 2019-08-23 ENCOUNTER — Encounter: Payer: Self-pay | Admitting: Gastroenterology

## 2019-08-23 ENCOUNTER — Ambulatory Visit: Payer: Medicare Other | Admitting: Gastroenterology

## 2019-08-23 VITALS — BP 134/80 | HR 76 | Temp 98.5°F | Ht 62.0 in | Wt 156.4 lb

## 2019-08-23 DIAGNOSIS — K625 Hemorrhage of anus and rectum: Secondary | ICD-10-CM

## 2019-08-23 DIAGNOSIS — K6289 Other specified diseases of anus and rectum: Secondary | ICD-10-CM | POA: Diagnosis not present

## 2019-08-23 DIAGNOSIS — K602 Anal fissure, unspecified: Secondary | ICD-10-CM

## 2019-08-23 DIAGNOSIS — K582 Mixed irritable bowel syndrome: Secondary | ICD-10-CM | POA: Diagnosis not present

## 2019-08-23 MED ORDER — NONFORMULARY OR COMPOUNDED ITEM: 0 refills | Status: DC

## 2019-08-23 NOTE — Progress Notes (Signed)
Gloria Atkins    [id number]    [birth year]  Primary Care Physician:Wilson, Jama Flavors, MD  Referring Physician: Christain Sacramento, MD [address] [place],  [place] [address]   Chief complaint:  Rectal bleeding, rectal pain       HPI:  74 year old female here for new patient visit with complaints of rectal bleeding  She has history of alternating constipation and diarrhea with regular bowel habits. In the past few weeks she started noticing intermittent rectal bleeding and rectal pain, she had a episode with large amount of blood dripping on Sunday after bowel movement and also had significant rectal discomfort.  Denies any dietary changes or medication changes  Colonoscopy 06/11/2012 Diverticulosis otherwise normal exam  Colonoscopy May 21, 2005: Diminutive polyp removed from sigmoid, Sigmoid diverticulitis  Colonoscopy August 22, 2007: Normal  EGD June 02, 2007: Normal  EGD June 11, 2012    Outpatient Encounter Medications as of 08/23/2019  Medication Sig  . aspirin EC 81 MG EC tablet Take 81 mg by mouth. OCCASIONALLY  . Cholecalciferol 25 MCG (1000 UT) tablet Take by mouth.  . Cyanocobalamin 2000 MCG TBCR Take by mouth.  . levothyroxine (SYNTHROID, LEVOTHROID) 88 MCG tablet Take 88 mcg by mouth daily. Takes 26mcg every other day and 179mcg the off days  . montelukast (SINGULAIR) 10 MG tablet TAKE 1 TABLET BY MOUTH AT  NIGHT  . omeprazole (PRILOSEC) 20 MG capsule Take 1 capsule (20 mg total) by mouth daily.  Marland Kitchen oxyCODONE (OXY IR/ROXICODONE) 5 MG immediate release tablet Take 5 mg by mouth every 6 (six) hours as needed.  . [DISCONTINUED] diazepam (VALIUM) 5 MG tablet Take 1 tablet (5 mg total) by mouth every 6 (six) hours as needed for anxiety.  . [DISCONTINUED] divalproex (DEPAKOTE ER) 250 MG 24 hr tablet Take 2 tablets (500 mg total) by mouth at bedtime. Start with one pill at night for 1 week then increase to two pills.  . [DISCONTINUED] levothyroxine  (SYNTHROID, LEVOTHROID) 100 MCG tablet Take 100 mcg by mouth every other day.   No facility-administered encounter medications on file as of 08/23/2019.    Allergies as of 08/23/2019 - Review Complete 08/23/2019  Allergen Reaction Noted  . Fosamax [alendronate] Diarrhea 08/29/2015    Past Medical History:  Diagnosis Date  . Anal fissure   . Arrhythmia    SOMETIMES HER HEART WILL BEAT REALLY FAST FOR NO REASON  . Arthritis   . Chronic knee pain   . Diastolic dysfunction   . Diverticulosis   . Esophageal dysmotility 1/14   stretching-improved swallowing since   . Gallstones   . GERD (gastroesophageal reflux disease)   . Heart palpitations    Negative event monitor 4/12  . Heartburn   . Hyperplastic colon polyp   . Hypothyroidism    thyroid removed  . Nausea   . Osteopenia     Past Surgical History:  Procedure Laterality Date  . ABDOMINAL HYSTERECTOMY  1980  . ANAL FISSURE REPAIR    . CHOLECYSTECTOMY  1970's  . COLONOSCOPY  1/14  . CYSTOSCOPY N/A 08/05/2012   Procedure: CYSTOSCOPY BLADDER BIOPSY ;  Surgeon: Malka So, MD;  Location: [facility];  Service: Urology;  Laterality: N/A;  . 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  HEEL SPUR SURGERY Right   . LIPOSUCTION    . THYROIDECTOMY Bilateral 1974    Family History  Problem Relation Age of Onset  . Coronary artery disease Father   . Heart attack Father   . Hypertension Father   . Breast cancer Sister 31  . Gallbladder disease Mother   . Osteoporosis Mother   . Diverticulitis Mother   . Alcoholism Brother   . Diverticulitis Sister   . Diverticulitis Sister   . Diverticulitis Sister   . Colon cancer Neg Hx   . Stomach cancer Neg Hx     Social History   Socioeconomic History  . Marital status: Single    Spouse name: Not on file  . Number of children:  2  . Years of education: 15  . Highest education level: Not on file  Occupational History  . Occupation: CNA-retired    Comment: [employer]  Tobacco Use  . Smoking status: Never Smoker  . Smokeless tobacco: Never Used  Substance and Sexual Activity  . Alcohol use: Yes    Comment: occasional -once or twice a year  . Drug use: No  . Sexual activity: Yes  Other Topics Concern  . Not on file  Social History Narrative   Lives alone   Caffeine use: Sweet tea daily   Social Determinants of Health   Financial Resource Strain:   . Difficulty of Paying Living Expenses:   Food Insecurity:   . Worried About Charity fundraiser in the Last Year:   . Arboriculturist in the Last Year:   Transportation Needs:   . Film/video editor (Medical):   Marland Kitchen Lack of Transportation (Non-Medical):   Physical Activity:   . Days of Exercise per Week:   . Minutes of Exercise per Session:   Stress:   . Feeling of Stress :   Social Connections:   . Frequency of Communication with Friends and Family:   . Frequency of Social Gatherings with Friends and Family:   . Attends Religious Services:   . Active Member of Clubs or Organizations:   . Attends Archivist Meetings:   Marland Kitchen Marital Status:   Intimate Partner Violence:   . Fear of Current or Ex-Partner:   . Emotionally Abused:   Marland Kitchen Physically Abused:   . Sexually Abused:       Review of systems: All other review of systems negative except as mentioned in the HPI.   Physical Exam: Vitals:   08/23/19 1054  BP: 134/80  Pulse: 76  Temp: 98.5 F (36.9 C)   Body mass index is 28.6 kg/m. Gen:      No acute distress Neuro: alert and oriented x 3 Psych: normal mood and affect Rectal exam: Increased anal sphincter tone with tenderness during exam, anterior anal fissure, non bleeding external hemorrhoids Anoscopy: not performed due to significant tenderness  Data Reviewed:  Reviewed labs, radiology imaging, old records and  pertinent past GI work up   Assessment and Plan/Recommendations:  74 yr F with intermittent rectal bleeding and discomfort likely secondary to anal fissure  IBS with alternating constipation and diarrhea  Start benefiber 1 tablespoon 2-3 times daily with meals  Apply small pea size amount of Nitroglycerine 0.125% 3-4 times daily X 6-8 weeks  Plan for colonoscopy in 2 months after healing of anal fissure to exclude any rectal neoplastic lesion given h/o intermittent rectal bleeding for past few months. Last colonsocopy >7 years ago by Dr Olevia Perches  Return in 6-8 weeks or sooner if needed   The patient was provided an opportunity to ask questions and all were answered. The patient agreed with the plan and demonstrated an understanding of the instructions.  Damaris Hippo , MD    CC: Christain Sacramento, MD

## 2019-08-23 NOTE — Patient Instructions (Addendum)
If you are age 73 or older, your body mass index should be between 23-30. Your Body mass index is 28.6 kg/m. If this is out of the aforementioned range listed, please consider follow up with your Primary Care Provider.  If you are age 64 or younger, your body mass index should be between 19-25. Your Body mass index is 28.6 kg/m. If this is out of the aformentioned range listed, please consider follow up with your Primary Care Provider.   Patient Drug Education for Nitroglycerin Ointment  Nitroglycerin ointment (NTG) is used to help heal anal fissures. The ointment relaxes the smooth muscle around the anus and promotes blood flow which helps heal the fissure (tear). The NTG reduces anal canal pressure, which diminishes pain and spasm. We use a diluted concentration of NTG (.125%) compared to the 2% that is typically used for heart patients, and this is why you need to obtain the medication from a pharmacy which will compound your prescription.  The NTG ointment should be applied 3 times per day, or as directed.  A pea-sized drop should be placed on the tip of your finger and then gently placed inside the anus. The finger should be inserted 1/3 - 1/2 its length and may be covered with a plastic glove or finger cot. You may use Vaseline to help coat the finger or dilute the ointment. If you are advised to mix the NTG with steroid ointment, limit the steroids to one to two weeks.  The first few applications should be taken lying down, as mild light-headedness or a brief headache may occur.  It may take several weeks for the fissure to begin healing, and you will need to continue taking the medication after resolution of your symptoms.  It is important to continue the treatment for the entire time period - up to 3 months or as directed. It takes up to two years for the healing tissue to regain the normal skin strength. You will be advised to add fiber to your diet, increase water intake to 7-8 glasses per  day, take relaxing baths or sitz baths, and avoid prolonged sitting and straining on the commode. Local anesthetic ointment may be added.  Initially, the anal fissure is very inflamed, which allows more of the NTG to get into the blood. This allows for a higher incidence of the most common side effect - a headache. It is usually brief and mild, but may require Tylenol or Advil. You may dilute the NTG further with Vaseline to decrease the headaches. As the treatment progresses and the fissure begins to heal, the headaches will tend to dissipate. Other side effects include lightheadedness, flushing, dizziness, nervousness, nausea, and vomiting. If any of these side effects persist or worsen, notify us promptly. Stop using the NTG and notify us immediately if you develop the rare side effects of severe dizziness, fainting, fast/pounding heartbeat, paleness, sweating, blurred vision, dry mouth, dark urine, bluish lips/skin/nails, unusual tiredness, severe weakness, irregular heartbeat, seizures, or chest pain. Serious allergic reactions are unusual, but seek immediate medical attention if you develop a rash, swelling, dizziness, or trouble breathing.  Tell us if you are allergic to nitrates, have severe anemia, low blood pressure, dehydration, chronic heart failure, cardiomyopathy, recent heart attack, increased pressure in the brain, or exposure to nitrates while on the job. Do not use NTG while driving or working around machinery if you are drowsy, dizzy, have lightheadedness, or blurred vision. Limit alcoholic beverages. To minimize dizziness and lightheadedness, get up  slowly when rising from a sitting or lying position. The elderly may be more prone to dizziness and falling. While there are not adequate studies to confirm the safety of NTG in pregnant or breast feeding women, it has been used without incident so far. We recommend waiting at least one hour after applying the NTG ointment before breast  feeding.  Do not use NTG ointment if you are taking drugs for sexual problems [e.g., sildenafil (Viagra), tadalafil (Cialis), vardenafil (Levitra)]. Use caution before taking cough-and-cold products, diet aids, or NSAIDs preparations because they may contain ingredients that could increase your blood pressure, cause a fast heartbeat, or increase chest pain (e.g., pseudoephedrine, phenylephrine, chlorpheniramine, diphenhydramine, clemastine, ibuprofen, and naproxen). Tell us if you drink alcohol, take alteplase, migraine drugs (ergotamine), water pills/diuretics such as furosemide or hydrochlorothiazide, or other drugs for high blood pressure (beta blockers, calcium channel blockers, ACE inhibitors).  Store the NTG at room temperature and keep away from light and moisture. Close the container tightly after each use. Do not store in the bathroom. Keep away from children and pets. If you have any questions or problems please call us at  [phone].  We have sent a prescription for nitroglycerin 0.125% gel to [facility]. You should apply a pea size amount to your rectum three times daily x 6-8 weeks.  [facility]'s information is below: Address: [address] [place], [place] [address]  [phone]  *Please DO NOT go directly from our office to pick up this medication! Give the pharmacy 1 day to process the prescription as this is compounded and takes time to make.   You can mix Nitroglycerine with a small amount of Recticare that can be purchase over the counter.  Please purchase the following medications over the counter and take as directed:  START: Benefiber take 1 tablespoon 2 -3 times daily with meals.   You are scheduled for follow up appointment on 10-24-19 at 10:40am.  Please arrive 10 minutes prior to appointment.  I appreciate the opportunity to care for you. Thank you for choosing me and [facility],  Dr. Harl Bowie

## 2019-10-24 ENCOUNTER — Ambulatory Visit: Payer: Medicare Other | Admitting: Gastroenterology

## 2019-10-24 ENCOUNTER — Encounter: Payer: Self-pay | Admitting: Gastroenterology

## 2019-10-24 DIAGNOSIS — R0989 Other specified symptoms and signs involving the circulatory and respiratory systems: Secondary | ICD-10-CM

## 2019-10-24 MED ORDER — SUPREP BOWEL PREP KIT 17.5-3.13-1.6 GM/177ML PO SOLN: 1.0000 | Freq: Once | ORAL | 0 refills | Status: AC

## 2019-10-24 NOTE — Patient Instructions (Signed)
You have been scheduled for a colonoscopy. Please follow written instructions given to you at your visit today.  Please pick up your prep supplies at the pharmacy within the next 1-3 days. If you use inhalers (even only as needed), please bring them with you on the day of your procedure. Your physician has requested that you go to www.startemmi.com and enter the access code given to you at your visit today. This web site gives a general overview about your procedure. However, you should still follow specific instructions given to you by our office regarding your preparation for the procedure.   Continue Nitroglycerin using a small sized pea amount three times daily  I appreciate the  opportunity to care for you  Thank You   Harl Bowie , MD

## 2019-10-24 NOTE — Progress Notes (Signed)
Gloria Atkins    [id number]    May 17, 1945  Primary Care Physician:Wilson, Jama Flavors, MD  Referring Physician: Christain Sacramento, MD [address] [place],  [place] [address]   Chief complaint:  Anal fissure, rectal bleeding   HPI:  12 yr F here for follow-up visit with complaints of rectal bleeding.  She was doing better after last visit in April with improvement of rectal discomfort but started having rectal bleeding, painless after she lifted some boxes over the weekend.  Denies any abdominal pain, loss of appetite or weight loss.  Colonoscopy 06/11/2012 Diverticulosis otherwise normal exam  Colonoscopy May 21, 2005: Diminutive polyp removed from sigmoid, Sigmoid diverticulitis  Colonoscopy August 22, 2007: Normal  EGD June 02, 2007: Normal  EGD June 11, 2012   Outpatient Encounter Medications as of 10/24/2019  Medication Sig   aspirin EC 81 MG EC tablet Take 81 mg by mouth. OCCASIONALLY   Cholecalciferol 25 MCG (1000 UT) tablet Take by mouth.   Cyanocobalamin 2000 MCG TBCR Take by mouth.   levothyroxine (SYNTHROID, LEVOTHROID) 88 MCG tablet Take 88 mcg by mouth daily. Takes 66mcg every other day and 151mcg the off days   montelukast (SINGULAIR) 10 MG tablet TAKE 1 TABLET BY MOUTH AT  NIGHT   NONFORMULARY OR COMPOUNDED ITEM Nitroglycerine ointment 0.125 %  Apply a pea sized amount internally 3-4 times daily for 6-8 weeks  Dispense 30 GM zero refill   omeprazole (PRILOSEC) 20 MG capsule Take 1 capsule (20 mg total) by mouth daily.   oxyCODONE (OXY IR/ROXICODONE) 5 MG immediate release tablet Take 5 mg by mouth every 6 (six) hours as needed.   No facility-administered encounter medications on file as of 10/24/2019.    Allergies as of 10/24/2019 - Review Complete 10/24/2019  Allergen Reaction Noted   Fosamax [alendronate] Diarrhea 08/29/2015    Past Medical History:  Diagnosis Date   Anal fissure    Arrhythmia    SOMETIMES HER  HEART WILL BEAT REALLY FAST FOR NO REASON   Arthritis    Chronic knee pain    Diastolic dysfunction    Diverticulosis    Esophageal dysmotility 1/14   stretching-improved swallowing since    Gallstones    GERD (gastroesophageal reflux disease)    Heart palpitations    Negative event monitor 4/12   Heartburn    Hyperplastic colon polyp    Hypothyroidism    thyroid removed   Nausea    Osteopenia     Past Surgical History:  Procedure Laterality Date   ABDOMINAL HYSTERECTOMY  1980   ANAL FISSURE REPAIR     CHOLECYSTECTOMY  1970's   COLONOSCOPY  1/14   CYSTOSCOPY N/A 08/05/2012   Procedure: CYSTOSCOPY BLADDER BIOPSY ;  Surgeon: Malka So, MD;  Location: [facility];  Service: Urology;  Laterality: N/A;   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/2012   Procedure: WITH FULGURATION ;  Surgeon: Malka So, MD;  Location: [facility];  Service: Urology;  Laterality: N/A;   HEEL SPUR SURGERY Right    LIPOSUCTION     THYROIDECTOMY Bilateral 1974    Family History  Problem Relation Age of Onset   Coronary artery disease Father    Heart attack Father    Hypertension Father    Breast cancer Sister 80   Gallbladder disease Mother    Osteoporosis Mother    Diverticulitis Mother  Alcoholism Brother    Diverticulitis Sister    Diverticulitis Sister    Diverticulitis Sister    Colon cancer Neg Hx    Stomach cancer Neg Hx     Social History   Socioeconomic History   Marital status: Single    Spouse name: Not on file   Number of children: 2   Years of education: 12   Highest education level: Not on file  Occupational History   Occupation: CNA-retired    Comment: [employer]  Tobacco Use   Smoking status: Never Smoker   Smokeless tobacco: Never Used  Scientific laboratory technician Use: Never used  Substance and Sexual Activity   Alcohol use: Yes    Comment: occasional -once or twice a year   Drug use: No     Sexual activity: Yes  Other Topics Concern   Not on file  Social History Narrative   Lives alone   Caffeine use: Sweet tea daily   Social Determinants of Health   Financial Resource Strain:    Difficulty of Paying Living Expenses:   Food Insecurity:    Worried About Charity fundraiser in the Last Year:    Arboriculturist in the Last Year:   Transportation Needs:    Film/video editor (Medical):    Lack of Transportation (Non-Medical):   Physical Activity:    Days of Exercise per Week:    Minutes of Exercise per Session:   Stress:    Feeling of Stress :   Social Connections:    Frequency of Communication with Friends and Family:    Frequency of Social Gatherings with Friends and Family:    Attends Religious Services:    Active Member of Clubs or Organizations:    Attends Music therapist:    Marital Status:   Intimate Partner Violence:    Fear of Current or Ex-Partner:    Emotionally Abused:    Physically Abused:    Sexually Abused:       Review of systems: All other review of systems negative except as mentioned in the HPI.   Physical Exam: Vitals:   10/24/19 1036  BP: 132/76  Pulse: 88   Body mass index is 28.6 kg/m. Gen:      No acute distress HEENT:   sclera anicteric Abd:      soft, non-tender; no palpable masses, no distension Ext:    No edema Neuro: alert and oriented x 3 Psych: normal mood and affect Rectal exam: Normal anal sphincter tone, no anal fissure or external hemorrhoids  Data Reviewed:  Reviewed labs, radiology imaging, old records and pertinent past GI work up   Assessment and Plan/Recommendations:  74 year old female with history of anal fissure, healed with persistent intermittent painless hematochezia  Last colonoscopy > 7 years ago by Dr. Olevia Perches Given persistent rectal bleeding even after healing of anal fissure, will proceed with colonoscopy for further evaluation to exclude neoplastic  lesion, polyp or malignancy.  The risks and benefits as well as alternatives of endoscopic procedure(s) have been discussed and reviewed. All questions answered. The patient agrees to proceed.  Anal fissure: Appears to be healed based on exam Advised patient to continue using small pea-sized amount of 0.125% nitroglycerin 3 times daily for additional 2 weeks to complete what she has left in the bottle and to prevent recurrent anal fissure   The patient was provided an opportunity to ask questions and all were answered. The patient agreed with  the plan and demonstrated an understanding of the instructions.  Damaris Hippo , MD    CC: Christain Sacramento, MD

## 2019-10-31 ENCOUNTER — Encounter: Payer: Self-pay | Admitting: Gastroenterology

## 2019-10-31 ENCOUNTER — Telehealth: Payer: Self-pay | Admitting: Gastroenterology

## 2019-10-31 ENCOUNTER — Other Ambulatory Visit: Payer: Self-pay

## 2019-10-31 NOTE — Telephone Encounter (Signed)
Spoke with the provider and the [facility]. Patient will be rescheduled. Spoke with the patient. She is in agreement with this decision. New date and instructions mailed to the patient. 11/18/19

## 2019-10-31 NOTE — Telephone Encounter (Signed)
Pt is calling regarding this msg.

## 2019-11-02 ENCOUNTER — Encounter: Payer: Medicare Other | Admitting: Gastroenterology

## 2019-11-15 ENCOUNTER — Telehealth: Payer: Self-pay | Admitting: Gastroenterology

## 2019-11-15 NOTE — Telephone Encounter (Signed)
Mailed instructions from 10/31/19 have not arrived through the mail as of today. Her colonoscopy is 11/18/19 at 2:30 pm. She cannot talk at this moment because she has guests in her home. She will call back when she is free. We can give her the new times from the 10/31/19 instructions that she will write on her old instructions from 10/24/19.  Changes are:  Procedure Date:  11/18/19 Arrival Time:  1:30 Procedure Time: 2:30    DAY OF PROCEDURE:   DATE: 11/18/19 DAY: Friday  Beginning at  9:30 am  (5 hours before the procedure):   You may drink clear liquids until 11:30 am (3 hours before procedure).    Everything else is the same

## 2019-11-15 NOTE — Telephone Encounter (Signed)
The patient calls back. Reviewed the new instructions with her. Questions invited and answered.

## 2019-11-18 ENCOUNTER — Other Ambulatory Visit: Payer: Self-pay

## 2019-11-18 ENCOUNTER — Encounter: Payer: Self-pay | Admitting: Gastroenterology

## 2019-11-18 ENCOUNTER — Ambulatory Visit (AMBULATORY_SURGERY_CENTER): Payer: Medicare Other | Admitting: Gastroenterology

## 2019-11-18 VITALS — BP 103/58 | HR 69 | Temp 97.3°F | Resp 21 | Ht 62.0 in | Wt 156.0 lb

## 2019-11-18 DIAGNOSIS — K649 Unspecified hemorrhoids: Secondary | ICD-10-CM

## 2019-11-18 DIAGNOSIS — D122 Benign neoplasm of ascending colon: Secondary | ICD-10-CM | POA: Diagnosis not present

## 2019-11-18 DIAGNOSIS — K625 Hemorrhage of anus and rectum: Secondary | ICD-10-CM

## 2019-11-18 DIAGNOSIS — K5730 Diverticulosis of large intestine without perforation or abscess without bleeding: Secondary | ICD-10-CM

## 2019-11-18 DIAGNOSIS — R0989 Other specified symptoms and signs involving the circulatory and respiratory systems: Secondary | ICD-10-CM

## 2019-11-18 DIAGNOSIS — K922 Gastrointestinal hemorrhage, unspecified: Secondary | ICD-10-CM

## 2019-11-18 MED ORDER — SODIUM CHLORIDE 0.9 % IV SOLN: 500.0000 mL | Freq: Once | INTRAVENOUS | Status: DC

## 2019-11-18 NOTE — Op Note (Signed)
[facility] [place] Patient Name: Gloria Atkins Procedure Date: 11/18/2019 2:29 PM MRN: [id number] Endoscopist: Mauri Pole , MD Age: 74 Referring MD:  Date of Birth: [birth year] Gender: Female Account #: [id number] Procedure:                Colonoscopy Indications:              Evaluation of unexplained GI bleeding presenting                            with Hematochezia Medicines:                Monitored Anesthesia Care Procedure:                Pre-Anesthesia Assessment:                           - Prior to the procedure, a History and Physical                            was performed, and patient medications and                            allergies were reviewed. The patient's tolerance of                            previous anesthesia was also reviewed. The risks                            and benefits of the procedure and the sedation                            options and risks were discussed with the patient.                            All questions were answered, and informed consent                            was obtained. Prior Anticoagulants: The patient has                            taken no previous anticoagulant or antiplatelet                            agents. ASA Grade Assessment: II - A patient with                            mild systemic disease. After reviewing the risks                            and benefits, the patient was deemed in                            satisfactory condition to undergo the procedure.  After obtaining informed consent, the colonoscope                            was passed under direct vision. Throughout the                            procedure, the patient's blood pressure, pulse, and                            oxygen saturations were monitored continuously. The                            Colonoscope was introduced through the anus and                            advanced to the the cecum, identified  by                            appendiceal orifice and ileocecal valve. The                            colonoscopy was performed without difficulty. The                            patient tolerated the procedure well. The quality                            of the bowel preparation was excellent. The                            ileocecal valve, appendiceal orifice, and rectum                            were photographed. Scope In: 2:46:38 PM Scope Out: 3:07:10 PM Scope Withdrawal Time: 0 hours 16 minutes 36 seconds  Total Procedure Duration: 0 hours 20 minutes 32 seconds  Findings:                 Hemorrhoids were found on perianal exam.                           Three sessile polyps were found in the ascending                            colon. The polyps were 5 to 11 mm in size. These                            polyps were removed with a cold snare. Resection                            and retrieval were complete.                           Two sessile polyps were found in the ascending  colon. The polyps were 1 to 2 mm in size. These                            polyps were removed with a cold biopsy forceps.                            Resection and retrieval were complete.                           Multiple small-mouthed diverticula were found in                            the sigmoid colon, descending colon and ascending                            colon.                           Non-bleeding external and prolapsed internal                            hemorrhoids were found during retroflexion. The                            hemorrhoids were large. Complications:            No immediate complications. Estimated Blood Loss:     Estimated blood loss was minimal. Impression:               - Hemorrhoids found on perianal exam.                           - Three 5 to 11 mm polyps in the ascending colon,                            removed with a cold snare. Resected and  retrieved.                           - Two 1 to 2 mm polyps in the ascending colon,                            removed with a cold biopsy forceps. Resected and                            retrieved.                           - Diverticulosis in the sigmoid colon, in the                            descending colon and in the ascending colon.                           - Non-bleeding external and prolapsed internal  hemorrhoids. Recommendation:           - Patient has a contact number available for                            emergencies. The signs and symptoms of potential                            delayed complications were discussed with the                            patient. Return to normal activities tomorrow.                            Written discharge instructions were provided to the                            patient.                           - Resume previous diet.                           - Continue present medications.                           - Await pathology results.                           - Repeat colonoscopy in 3 years for surveillance                            based on pathology results.                           - Refer to a colo-rectal surgeon Dr Leighton Ruff                            at appointment to be scheduled for hemorrhoidectomy                            for symptomatic prolapsed hemorrhoids. Mauri Pole, MD 11/18/2019 3:15:16 PM This report has been signed electronically.

## 2019-11-18 NOTE — Progress Notes (Signed)
Pt. Asleep with Dr. Silverio Decamp came in the RR.  Pt. Held in Recovery until Dr. Silverio Decamp available following next case for procedure results.  Hence delayed discharge time.

## 2019-11-18 NOTE — Progress Notes (Signed)
PT taken to PACU. Monitors in place. VSS. Report given to RN. 

## 2019-11-18 NOTE — Progress Notes (Signed)
Called to room to assist during endoscopic procedure.  Patient ID and intended procedure confirmed with present staff. Received instructions for my participation in the procedure from the performing physician.  

## 2019-11-18 NOTE — Patient Instructions (Signed)
Impression/Recommendations:  Polyp handout given to patient. Diverticulosis handout given to patient. Hemorrhoid handout given to patient.  Resume previous diet.   Continue present medications.  Await pathology results.  Repeat colonoscopy in 3 years for surveillance.  Refer to colo-rectal surgeon Dr. Leighton Ruff for hemorrhoidectomy for symptomatic prolapsed hemorrhoids.  YOU HAD AN ENDOSCOPIC PROCEDURE TODAY AT [facility]:   Refer to the procedure report that was given to you for any specific questions about what was found during the examination.  If the procedure report does not answer your questions, please call your gastroenterologist to clarify.  If you requested that your care partner not be given the details of your procedure findings, then the procedure report has been included in a sealed envelope for you to review at your convenience later.  YOU SHOULD EXPECT: Some feelings of bloating in the abdomen. Passage of more gas than usual.  Walking can help get rid of the air that was put into your GI tract during the procedure and reduce the bloating. If you had a lower endoscopy (such as a colonoscopy or flexible sigmoidoscopy) you may notice spotting of blood in your stool or on the toilet paper. If you underwent a bowel prep for your procedure, you may not have a normal bowel movement for a few days.  Please Note:  You might notice some irritation and congestion in your nose or some drainage.  This is from the oxygen used during your procedure.  There is no need for concern and it should clear up in a day or so.  SYMPTOMS TO REPORT IMMEDIATELY:   Following lower endoscopy (colonoscopy or flexible sigmoidoscopy):  Excessive amounts of blood in the stool  Significant tenderness or worsening of abdominal pains  Swelling of the abdomen that is new, acute  Fever of 100F or higher For urgent or emergent issues, a gastroenterologist can be reached at any hour by  calling [phone]. Do not use MyChart messaging for urgent concerns.    DIET:  We do recommend a small meal at first, but then you may proceed to your regular diet.  Drink plenty of fluids but you should avoid alcoholic beverages for 24 hours.  ACTIVITY:  You should plan to take it easy for the rest of today and you should NOT DRIVE or use heavy machinery until tomorrow (because of the sedation medicines used during the test).    FOLLOW UP: Our staff will call the number listed on your records 48-72 hours following your procedure to check on you and address any questions or concerns that you may have regarding the information given to you following your procedure. If we do not reach you, we will leave a message.  We will attempt to reach you two times.  During this call, we will ask if you have developed any symptoms of COVID 19. If you develop any symptoms (ie: fever, flu-like symptoms, shortness of breath, cough etc.) before then, please call [phone].  If you test positive for Covid 19 in the 2 weeks post procedure, please call and report this information to [place].    If any biopsies were taken you will be contacted by phone or by letter within the next 1-3 weeks.  Please call us at [phone] if you have not heard about the biopsies in 3 weeks.    SIGNATURES/CONFIDENTIALITY: You and/or your care partner have signed paperwork which will be entered into your electronic medical record.  These signatures attest to the  fact that that the information above on your After Visit Summary has been reviewed and is understood.  Full responsibility of the confidentiality of this discharge information lies with you and/or your care-partner.

## 2019-11-18 NOTE — Progress Notes (Signed)
History reviewed today  VS CW  

## 2019-11-22 ENCOUNTER — Telehealth: Payer: Self-pay

## 2019-11-22 NOTE — Telephone Encounter (Signed)
  Follow up Call-  Call back number 11/18/2019  Post procedure Call Back phone  # [phone]  Permission to leave phone message Yes  Some recent data might be hidden     Patient questions:  Do you have a fever, pain , or abdominal swelling? No. Pain Score  0 *  Have you tolerated food without any problems? Yes.    Have you been able to return to your normal activities? Yes.    Do you have any questions about your discharge instructions: Diet   No. Medications  No. Follow up visit  No.  Do you have questions or concerns about your Care? No.  Actions: * If pain score is 4 or above: No action needed, pain <4.  Pt said she has not had a BM yet.  I advised her it was not uncommon.  Pt also said she had a death of a friend and had not been eating much.  I told her not to worry but give time.  If she needs [place] to call back. Maw  1. Have you developed a fever since your procedure? no  2.   Have you had an respiratory symptoms (SOB or cough) since your procedure? no  3.   Have you tested positive for COVID 19 since your procedure no  4.   Have you had any family members/close contacts diagnosed with the COVID 19 since your procedure?  no   If yes to any of these questions please route to Joylene John, RN and Erenest Rasher, RN

## 2019-11-29 ENCOUNTER — Encounter: Payer: Self-pay | Admitting: Gastroenterology

## 2020-10-23 ENCOUNTER — Other Ambulatory Visit: Payer: Self-pay | Admitting: Orthopedic Surgery

## 2021-07-08 ENCOUNTER — Other Ambulatory Visit: Payer: Self-pay | Admitting: Physician Assistant

## 2021-07-08 DIAGNOSIS — N9489 Other specified conditions associated with female genital organs and menstrual cycle: Secondary | ICD-10-CM

## 2021-07-25 ENCOUNTER — Other Ambulatory Visit: Payer: Medicare Other

## 2021-07-26 ENCOUNTER — Other Ambulatory Visit: Payer: Self-pay | Admitting: Family Medicine

## 2021-07-26 DIAGNOSIS — N9489 Other specified conditions associated with female genital organs and menstrual cycle: Secondary | ICD-10-CM

## 2021-07-29 ENCOUNTER — Ambulatory Visit
Admission: RE | Admit: 2021-07-29 | Discharge: 2021-07-29 | Disposition: A | Discharge status: Home / Self Care | Payer: No Typology Code available for payment source | Source: Ambulatory Visit | Attending: Family Medicine | Admitting: Family Medicine

## 2021-07-29 DIAGNOSIS — N9489 Other specified conditions associated with female genital organs and menstrual cycle: Secondary | ICD-10-CM

## 2021-08-15 ENCOUNTER — Encounter: Payer: Self-pay | Admitting: Podiatry

## 2021-08-15 ENCOUNTER — Ambulatory Visit (INDEPENDENT_AMBULATORY_CARE_PROVIDER_SITE_OTHER): Payer: No Typology Code available for payment source

## 2021-08-15 ENCOUNTER — Ambulatory Visit (INDEPENDENT_AMBULATORY_CARE_PROVIDER_SITE_OTHER): Payer: No Typology Code available for payment source | Admitting: Podiatry

## 2021-08-15 DIAGNOSIS — M722 Plantar fascial fibromatosis: Secondary | ICD-10-CM

## 2021-08-15 MED ORDER — TRIAMCINOLONE ACETONIDE 10 MG/ML IJ SUSP
10.0000 mg | Freq: Once | INTRAMUSCULAR | Status: AC
  Administered 2021-08-15: 10 mg

## 2021-08-16 NOTE — Progress Notes (Signed)
Subjective:  ? ?Patient ID: Gloria Atkins, female   DOB: 76 y.o.   MRN: [id number]  ? ?HPI ?Patient presents stating she has had a lot of pain in her left arch and mild in her right arch.  States its been very sore and it is making it hard for her to walk comfortably and patient does not smoke likes to be active.  States its been getting worse over the last few months ? ? ?Review of Systems  ?All other systems reviewed and are negative. ? ? ?   ?Objective:  ?Physical Exam ?Vitals and nursing note reviewed.  ?Constitutional:   ?   Appearance: She is well-developed.  ?Pulmonary:  ?   Effort: Pulmonary effort is normal.  ?Musculoskeletal:     ?   General: Normal range of motion.  ?Skin: ?   General: Skin is warm.  ?Neurological:  ?   Mental Status: She is alert.  ?  ?Neurovascular status was found to be intact muscle strength is adequate range of motion adequate.  Patient is found to have exquisite discomfort in the plantar aspect of the left arch with inflammation fluid buildup and mild to moderate pain in the right arch.  Good digital perfusion well oriented x3 ? ?   ?Assessment:  ?Acute plantar fasciitis left mid arch mild in the right ? ?   ?Plan:  ?H&P discussed both conditions and x-rays.  Today I went ahead did sterile prep and injected the mid arch left 3 mg Kenalog 5 mg Xylocaine applied fascial brace to lift up the arch and take pressure off the heel and the insertional point of the tendon into the calcaneus and advised on physical therapy shoe gear modification.  Reappoint to recheck ? ?X-rays indicate no signs of spur no presence of stress fracture of a significant nature with moderate depression of the arch ?   ? ? ?

## 2021-09-18 ENCOUNTER — Other Ambulatory Visit: Payer: Self-pay | Admitting: Family Medicine

## 2021-09-18 DIAGNOSIS — Z1231 Encounter for screening mammogram for malignant neoplasm of breast: Secondary | ICD-10-CM

## 2021-10-02 ENCOUNTER — Other Ambulatory Visit: Payer: Self-pay | Admitting: Family Medicine

## 2021-10-02 DIAGNOSIS — M810 Age-related osteoporosis without current pathological fracture: Secondary | ICD-10-CM

## 2021-10-10 ENCOUNTER — Encounter: Payer: No Typology Code available for payment source | Admitting: Obstetrics & Gynecology

## 2021-10-29 ENCOUNTER — Other Ambulatory Visit: Payer: Self-pay

## 2021-10-29 ENCOUNTER — Ambulatory Visit (INDEPENDENT_AMBULATORY_CARE_PROVIDER_SITE_OTHER): Payer: No Typology Code available for payment source | Admitting: Family Medicine

## 2021-10-29 ENCOUNTER — Encounter: Payer: Self-pay | Admitting: Family Medicine

## 2021-10-29 VITALS — BP 149/93 | HR 81 | Ht 63.0 in | Wt 151.7 lb

## 2021-10-29 DIAGNOSIS — N83201 Unspecified ovarian cyst, right side: Secondary | ICD-10-CM | POA: Diagnosis not present

## 2021-10-29 NOTE — Assessment & Plan Note (Signed)
On review of chart does not appear to have been any progression since 2017 and in fact cyst is measuring slightly smaller than last imaging. Reviewed with patient that same options remain as when she last saw Dr. Andrey Farmer, can either do yearly surveillance with TVUS or can refer her to Gyn Onc to get it removed. At present she elects for active surveillance. Will order TVUS (though patient understands she may not be able to schedule this right away) and see her back in clinic next February.

## 2022-01-27 ENCOUNTER — Ambulatory Visit: Payer: No Typology Code available for payment source

## 2022-02-21 ENCOUNTER — Other Ambulatory Visit: Payer: Self-pay | Admitting: Family Medicine

## 2022-02-21 ENCOUNTER — Ambulatory Visit
Admission: RE | Admit: 2022-02-21 | Discharge: 2022-02-21 | Disposition: A | Discharge status: Home / Self Care | Payer: No Typology Code available for payment source | Source: Ambulatory Visit | Attending: Family Medicine | Admitting: Family Medicine

## 2022-02-21 DIAGNOSIS — N6310 Unspecified lump in the right breast, unspecified quadrant: Secondary | ICD-10-CM

## 2022-02-21 DIAGNOSIS — Z1231 Encounter for screening mammogram for malignant neoplasm of breast: Secondary | ICD-10-CM

## 2022-03-04 ENCOUNTER — Other Ambulatory Visit: Payer: Self-pay | Admitting: Family Medicine

## 2022-03-04 DIAGNOSIS — N6310 Unspecified lump in the right breast, unspecified quadrant: Secondary | ICD-10-CM

## 2022-03-07 ENCOUNTER — Other Ambulatory Visit: Payer: No Typology Code available for payment source

## 2022-03-13 ENCOUNTER — Ambulatory Visit
Admission: RE | Admit: 2022-03-13 | Discharge: 2022-03-13 | Disposition: A | Discharge status: Home / Self Care | Payer: No Typology Code available for payment source | Source: Ambulatory Visit | Attending: Family Medicine | Admitting: Family Medicine

## 2022-03-13 ENCOUNTER — Ambulatory Visit: Admission: RE | Admit: 2022-03-13 | Payer: No Typology Code available for payment source | Source: Ambulatory Visit

## 2022-03-13 DIAGNOSIS — N6310 Unspecified lump in the right breast, unspecified quadrant: Secondary | ICD-10-CM

## 2022-05-29 IMAGING — US US PELVIS COMPLETE WITH TRANSVAGINAL
1 series · 13 of 25 positions shown · non-contrast
Comparison: 11/21/2015

CLINICAL DATA: Follow-up adnexal mass

EXAM:
TRANSABDOMINAL AND TRANSVAGINAL ULTRASOUND OF PELVIS
TECHNIQUE: Both transabdominal and transvaginal ultrasound examinations of the
pelvis were performed. Transabdominal technique was performed for
global imaging of the pelvis including uterus, ovaries, adnexal
regions, and pelvic cul-de-sac. It was necessary to proceed with
endovaginal exam following the transabdominal exam to visualize the
ovaries, and to characterize a cystic lesion in the adnexa.

[Series 1: us pelvis complete with transvaginal · 0.30mm/px · 42 acquisitions, 13 frames shown]
[im 1/42]
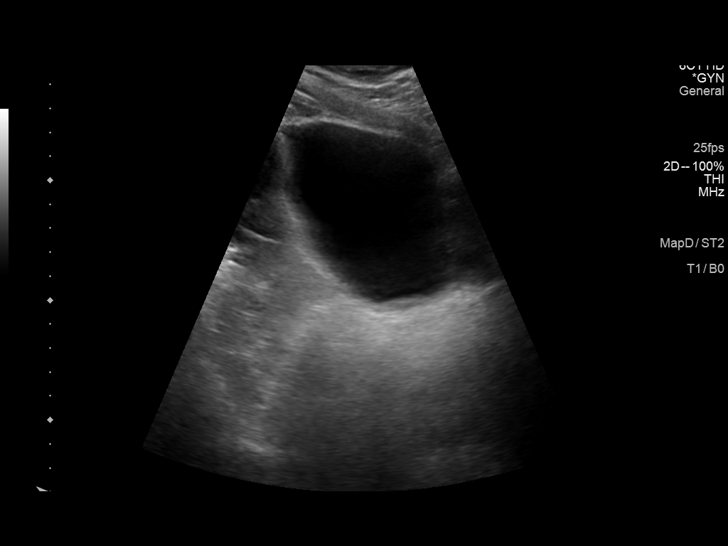
[im 4/42]
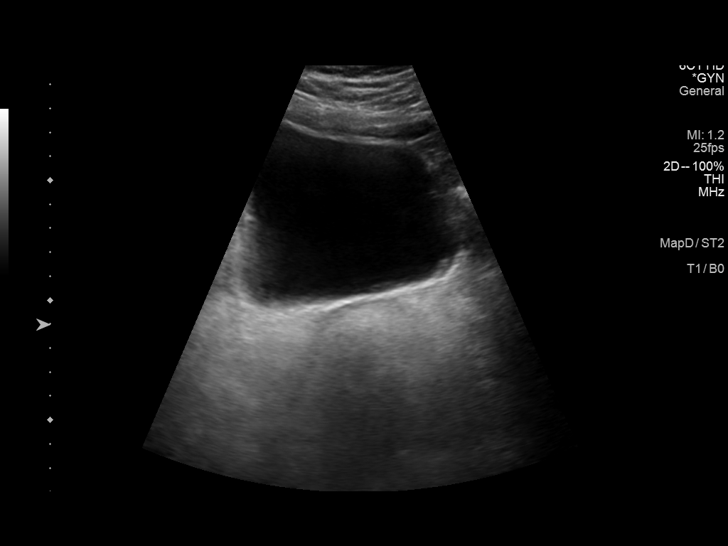
[im 7/42]
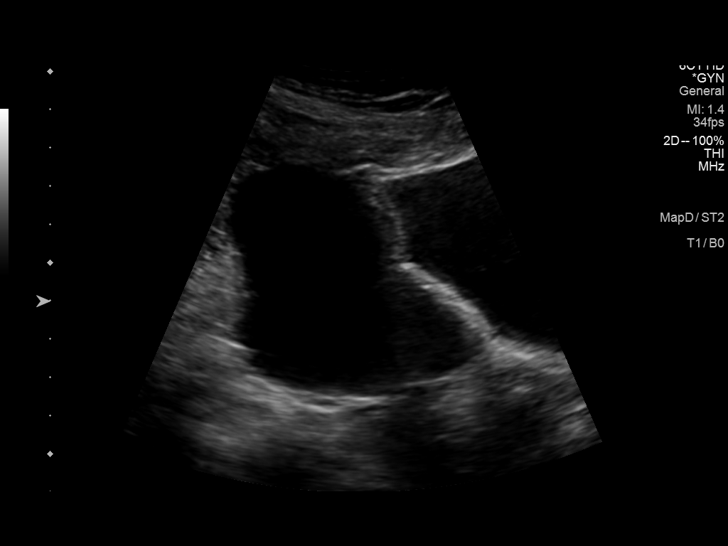
[im 11/42]
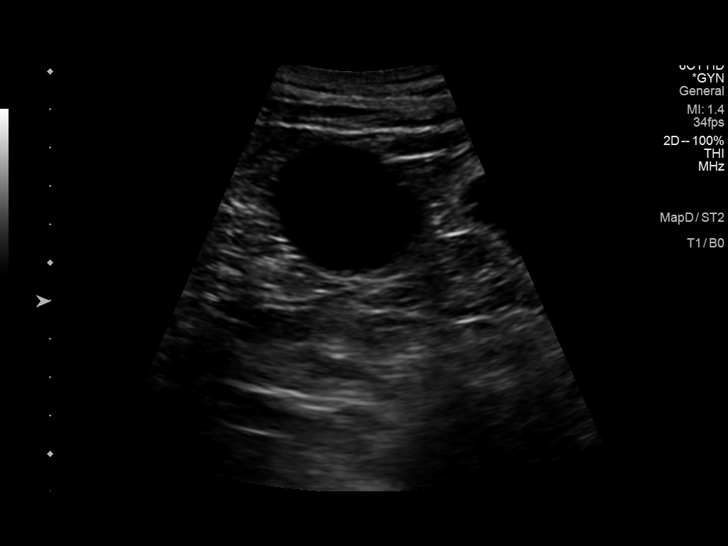
[im 14/42]
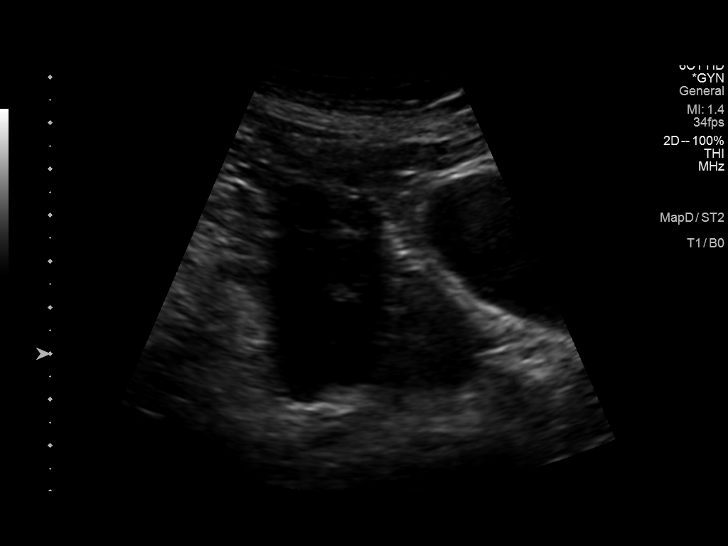
[im 18/42]
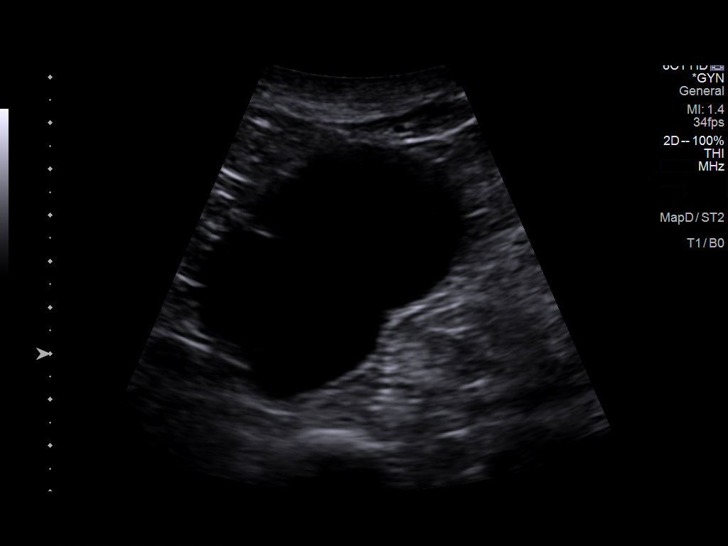
[im 21/42]
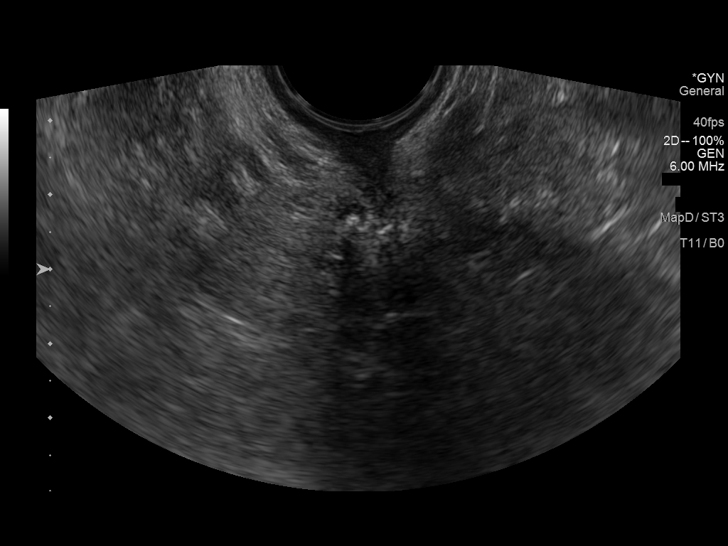
[im 24/42]
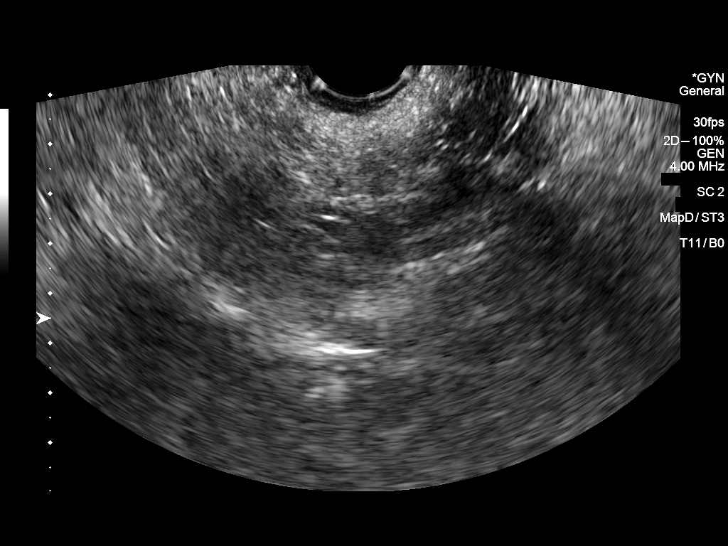
[im 28/42]
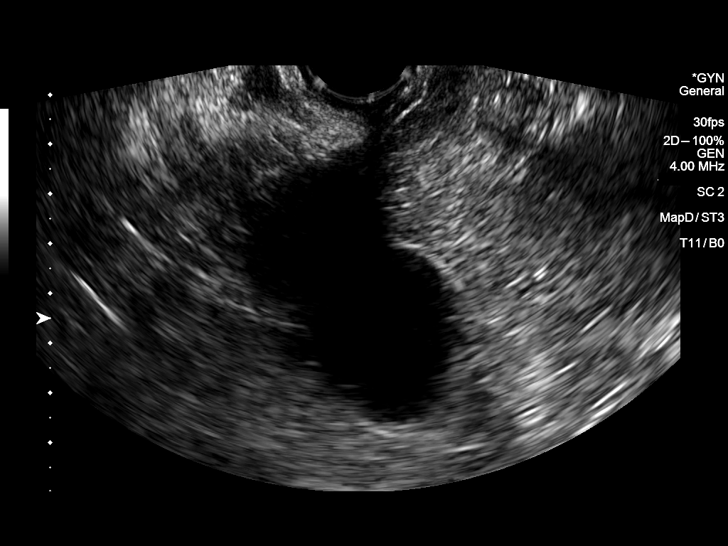
[im 31/42]
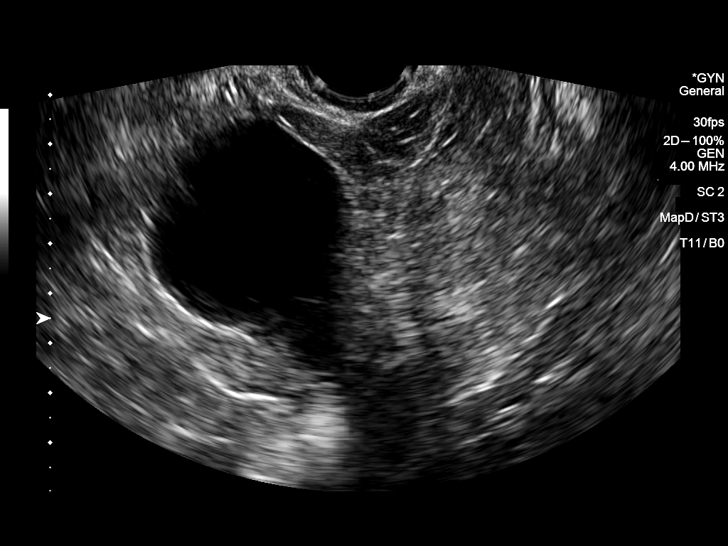
[im 35/42]
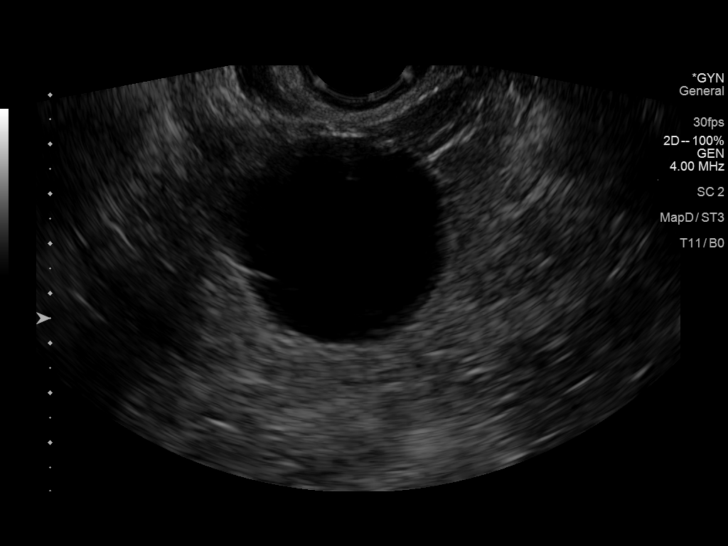
[im 38/42]
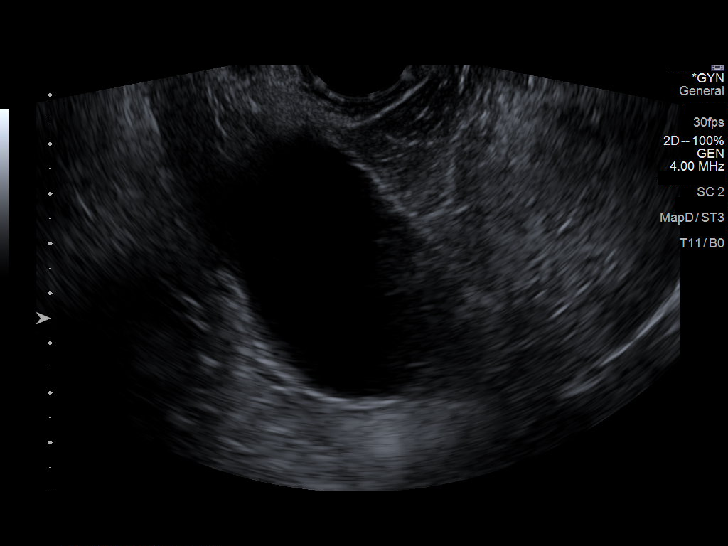
[im 42/42]
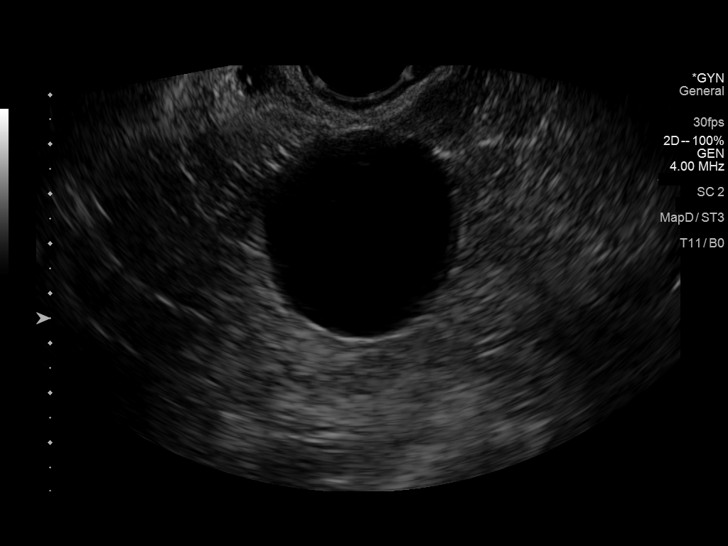

[13 of 25 positions shown; findings below may reference images not displayed]

FINDINGS: Uterus

Surgically absent

Endometrium

Surgically absent

Right ovary

No normal appearing ovary visualized, see below

Left ovary

Not visualized, question atrophic, resected, or obscured by bowel

Other findings

No free pelvic fluid. Cystic mass identified within RIGHT adnexa
x 4.5 x 6.1 cm (volume = 100 cm^3), containing a thin septation
and scattered internal echoes. No definite mural nodularity.
Previously this lesion measured 8.2 x 5.0 x 4.2 cm (volume = 90
cm^3).
IMPRESSION: Measures smaller in greatest dimension but increased in volume by
11% since the previous study.

Lesion is complicated by a thin septation and a few scattered
internal echoes versus artifacts, though no discrete mural nodule is
seen.

Either characterization of this lesion by MR imaging with and
without contrast or surgical evaluation recommended.

## 2022-09-22 ENCOUNTER — Encounter: Payer: Self-pay | Admitting: Gastroenterology

## 2022-10-28 LAB — GLUCOSE, POCT (MANUAL RESULT ENTRY): Glucose Fasting, POC: 96 mg/dL (ref 70–99)

## 2022-11-27 ENCOUNTER — Ambulatory Visit (AMBULATORY_SURGERY_CENTER): Payer: Medicare HMO

## 2022-11-27 ENCOUNTER — Encounter: Payer: Self-pay | Admitting: Gastroenterology

## 2022-11-27 VITALS — Ht 63.0 in | Wt 146.0 lb

## 2022-11-27 DIAGNOSIS — Z86010 Personal history of colonic polyps: Secondary | ICD-10-CM

## 2022-11-27 MED ORDER — NA SULFATE-K SULFATE-MG SULF 17.5-3.13-1.6 GM/177ML PO SOLN
1.0000 | Freq: Once | ORAL | 0 refills | Status: AC
Start: 2022-11-27 — End: 2022-11-27

## 2022-11-27 NOTE — Progress Notes (Signed)
No egg or soy allergy known to patient  No issues known to pt with past sedation with any surgeries or procedures Patient denies ever being told they had issues or difficulty with intubation  No FH of Malignant Hyperthermia Pt is not on diet pills Pt is not on  home 02  Pt is not on blood thinners  Pt has issues with constipation takes Benefiber - told to hold for 5 days prior to procedure No A fib or A flutter Have any cardiac testing pending--no Pt can ambulate Independently Pt denies use of chewing tobacco Discussed diabetic I weight loss medication holds Discussed NSAID holds Checked BMI Pt instructed to use [employer] or GoodRx for a price reduction on prep  Patient's chart reviewed by Cathlyn Parsons CNRA prior to previsit and patient appropriate for the [facility].  Pre visit completed and red dot placed by patient's name on their procedure day (on provider's schedule).

## 2022-12-18 ENCOUNTER — Encounter: Payer: No Typology Code available for payment source | Admitting: Gastroenterology

## 2023-02-13 ENCOUNTER — Other Ambulatory Visit: Payer: Self-pay | Admitting: Family Medicine

## 2023-02-13 DIAGNOSIS — Z1231 Encounter for screening mammogram for malignant neoplasm of breast: Secondary | ICD-10-CM

## 2023-03-17 ENCOUNTER — Ambulatory Visit
Admission: RE | Admit: 2023-03-17 | Discharge: 2023-03-17 | Disposition: A | Discharge status: Home / Self Care | Payer: Medicare HMO | Source: Ambulatory Visit | Attending: Family Medicine | Admitting: Family Medicine

## 2023-03-17 DIAGNOSIS — Z1231 Encounter for screening mammogram for malignant neoplasm of breast: Secondary | ICD-10-CM

## 2023-06-22 ENCOUNTER — Encounter: Payer: Self-pay | Admitting: Gastroenterology

## 2023-06-22 ENCOUNTER — Ambulatory Visit: Payer: PPO | Admitting: Gastroenterology

## 2023-06-22 VITALS — BP 140/88 | HR 76 | Ht 64.0 in | Wt 152.0 lb

## 2023-06-22 DIAGNOSIS — R1310 Dysphagia, unspecified: Secondary | ICD-10-CM

## 2023-06-22 DIAGNOSIS — K625 Hemorrhage of anus and rectum: Secondary | ICD-10-CM

## 2023-06-22 DIAGNOSIS — Z860101 Personal history of adenomatous and serrated colon polyps: Secondary | ICD-10-CM

## 2023-06-22 DIAGNOSIS — K649 Unspecified hemorrhoids: Secondary | ICD-10-CM

## 2023-06-22 DIAGNOSIS — K219 Gastro-esophageal reflux disease without esophagitis: Secondary | ICD-10-CM

## 2023-06-22 DIAGNOSIS — Z860100 Personal history of colon polyps, unspecified: Secondary | ICD-10-CM

## 2023-06-22 MED ORDER — AMBULATORY NON FORMULARY MEDICATION: 1 refills | Status: DC

## 2023-06-22 MED ORDER — SUFLAVE 178.7 G PO SOLR: 1.0000 | Freq: Once | ORAL | 0 refills | Status: AC

## 2023-06-22 NOTE — Progress Notes (Signed)
 Chief Complaint:recall colon Primary GI MD: Dr. Lavon Paganini  HPI: 78 year old female history of hypothyroidism, hyperlipidemia, esophageal dysmotility, and others as listed below presents for evaluation of  June 2021 by Dr. Lavon Paganini.  At that time she was having rectal bleeding secondary to anal fissure and was given NTG .125% and set up for colonoscopy.  Colonoscopy 11/18/2019 - Hemorrhoids on perianal exam - Three 5 to 11 mm polyps in ascending colon.  Resected and retrieved. - Two 1 to 2 mm polyps in ascending colon.  Resected and retrieved. - Diverticulosis in sigmoid, descending, ascending colon - Prolapsed internal hemorrhoids, nonbleeding - Biopsy showed 5 tubular adenomas and 1 inflammatory polyp - Repeat 3 years  -----------------TODAY-----------------  Today patient states she has been having dysphagia and GERD.  Significant GERD despite pantoprazole 40 Mg once daily.  She has had a history of dysphagia in the past in which she had esophageal dilations with adequate relief.  She states she is having dysphagia mainly to bread.  Not much of an issue if she can avoid bread.  She would like another esophageal dilation.  She continues to have rectal bleeding.  She states the nitroglycerin prescription she got in 2021 provided some relief.  She thinks it may be due to her hemorrhoids.  She was noted to have prolapsed internal hemorrhoids on colonoscopy in 2021.  Denies change in bowel habits.  Denies weight loss and abdominal pain.   PREVIOUS GI WORKUP   Colonoscopy 06/11/2012 Diverticulosis otherwise normal exam   Colonoscopy May 21, 2005: Diminutive polyp removed from sigmoid, Sigmoid diverticulitis   Colonoscopy August 22, 2007: Normal   EGD June 02, 2007: Normal   EGD June 11, 2012  Past Medical History:  Diagnosis Date   Anal fissure    Arrhythmia    SOMETIMES HER HEART WILL BEAT REALLY FAST FOR NO REASON   Arthritis    Cataract    Chronic knee pain     Diastolic dysfunction    Diverticulosis    Esophageal dysmotility 05/2012   stretching-improved swallowing since    Gallstones    GERD (gastroesophageal reflux disease)    Heart palpitations    Negative event monitor 4/12   Heartburn    Hyperlipidemia    Hyperplastic colon polyp    Hypothyroidism    thyroid removed   Nausea    Osteopenia    Osteoporosis     Past Surgical History:  Procedure Laterality Date   ABDOMINAL HYSTERECTOMY  1980   ANAL FISSURE REPAIR     BREAST REDUCTION SURGERY Bilateral 2016   CHOLECYSTECTOMY  1970's   COLONOSCOPY  05/2012   CYSTOSCOPY N/A 08/05/2012   Procedure: CYSTOSCOPY BLADDER BIOPSY ;  Surgeon: Anner Crete, MD;  Location: [facility];  Service: Urology;  Laterality: N/A;   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 45 g 1   aspirin EC 81 MG EC tablet Take 81 mg by mouth. OCCASIONALLY     Calcium Carb-Cholecalciferol (CALCIUM 500 + D PO) Take by mouth.  cholecalciferol (VITAMIN D3) 25 MCG (1000 UNIT) tablet Take by mouth.     Cyanocobalamin 2000 MCG TBCR Take by mouth.     levothyroxine (SYNTHROID, LEVOTHROID) 88 MCG tablet Take 88 mcg by mouth daily. Takes every other day and the off days     montelukast (SINGULAIR) 10 MG tablet Take by mouth.     Multiple Vitamins-Minerals (MULTIVITAMIN ADULTS PO) Take 1 tablet by mouth daily.     oxyCODONE (OXY IR/ROXICODONE) 5 MG immediate release tablet Take by mouth.     SUFLAVE 178.7 g SOLR Take 1 kit by mouth once for 1 dose. 1 each 0   PROLIA 60  MG/ML SOSY injection Inject into the skin.     No current facility-administered medications for this visit.    Allergies as of 06/22/2023 - Review Complete 06/22/2023  Allergen Reaction Noted   Fosamax [alendronate] Diarrhea 08/29/2015   Denosumab  02/21/2020   Ibandronate Other (See Comments) 09/09/2017   Phentermine  02/28/2021    Family History  Problem Relation Age of Onset   Gallbladder disease Mother    Osteoporosis Mother    Diverticulitis Mother    Coronary artery disease Father    Heart attack Father    Hypertension Father    Breast cancer Sister 29   Diverticulitis Sister    Diverticulitis Sister    Diverticulitis Sister    Alcoholism Brother    Colon cancer Neg Hx    Esophageal cancer Neg Hx    Liver disease Neg Hx     Social History   Socioeconomic History   Marital status: Single    Spouse name: Not on file   Number of children: 2   Years of education: 12   Highest education level: Not on file  Occupational History   Occupation: CNA-retired    Comment: [employer]  Tobacco Use   Smoking status: Never   Smokeless tobacco: Never  Vaping Use   Vaping status: Never Used  Substance and Sexual Activity   Alcohol use: Yes    Comment: occasional -once or twice a week   Drug use: No   Sexual activity: Not Currently    Birth control/protection: Post-menopausal  Other Topics Concern   Not on file  Social History Narrative   Lives alone   Caffeine use: Sweet tea daily   Social Drivers of Corporate investment banker Strain: Not on file  Food Insecurity: Low Risk  (02/27/2023)   Received from [facility]   Hunger Vital Sign    Worried About Running Out of Food in the Last Year: Never true    Ran Out of Food in the Last Year: Never true  Transportation Needs: No Transportation Needs (02/27/2023)   Received from [facility]    In the past 12 months, has lack of reliable transportation kept you from medical appointments, meetings,  work or from getting things needed for daily living? : No  Physical Activity: Not on file  Stress: Not on file  Social Connections: Not on file  Intimate Partner Violence: Not At Risk (10/28/2022)   Humiliation, Afraid, Rape, and Kick questionnaire    Fear of Current or Ex-Partner: No    Emotionally Abused: No    Physically Abused: No    Sexually Abused: No    Review of Systems:    Constitutional: No weight loss, fever, chills, weakness or fatigue HEENT: Eyes: No change in vision  Ears, Nose, Throat:  No change in hearing or congestion Skin: No rash or itching Cardiovascular: No chest pain, chest pressure or palpitations   Respiratory: No SOB or cough Gastrointestinal: See HPI and otherwise negative Genitourinary: No dysuria or change in urinary frequency Neurological: No headache, dizziness or syncope Musculoskeletal: No new muscle or joint pain Hematologic: No bleeding or bruising Psychiatric: No history of depression or anxiety    Physical Exam:  Vital signs: BP (!) 140/88   Pulse 76   Ht 5\' 4"  (1.626 m)   Wt 152 lb (68.9 kg)   BMI 26.09 kg/m   Constitutional: NAD, Well developed, Well nourished, alert and cooperative Head:  Normocephalic and atraumatic. Eyes:   PEERL, EOMI. No icterus. Conjunctiva pink. Respiratory: Respirations even and unlabored. Lungs clear to auscultation bilaterally.   No wheezes, crackles, or rhonchi.  Cardiovascular:  Regular rate and rhythm. No peripheral edema, cyanosis or pallor.  Gastrointestinal:  Soft, nondistended, nontender. No rebound or guarding. Normal bowel sounds. No appreciable masses or hepatomegaly. Rectal:  Not performed.  Declined, deferred to colonoscopy Msk:  Symmetrical without gross deformities. Without edema, no deformity or joint abnormality.  Neurologic:  Alert and  oriented x4;  grossly normal neurologically.  Skin:   Dry and intact without significant lesions or rashes. Psychiatric: Oriented to person,  place and time. Demonstrates good judgement and reason without abnormal affect or behaviors.   RELEVANT LABS AND IMAGING: CBC    Component Value Date/Time   WBC 6.4 03/20/2016 1523   WBC 6.2 10/03/2012 0430   RBC 4.59 03/20/2016 1523   RBC 3.69 (L) 10/03/2012 0430   HGB 12.5 03/20/2016 1523   HCT 39.1 03/20/2016 1523   PLT 351 03/20/2016 1523   MCV 85 03/20/2016 1523   MCH 27.2 03/20/2016 1523   MCH 27.6 10/03/2012 0430   MCHC 32.0 03/20/2016 1523   MCHC 33.1 10/03/2012 0430   RDW 13.9 03/20/2016 1523   LYMPHSABS 2.2 10/02/2012 0540   MONOABS 0.5 10/02/2012 0540   EOSABS 0.3 10/02/2012 0540   BASOSABS 0.0 10/02/2012 0540    CMP     Component Value Date/Time   NA 142 03/20/2016 1523   K 4.8 03/20/2016 1523   CL 102 03/20/2016 1523   CO2 25 03/20/2016 1523   GLUCOSE 88 03/20/2016 1523   GLUCOSE 121 (H) 09/30/2012 0610   BUN 11 03/20/2016 1523   CREATININE 0.76 03/20/2016 1523   CREATININE 0.82 12/21/2015 1053   CREATININE 0.84 12/21/2015 1053   CALCIUM 9.5 03/20/2016 1523   PROT 6.6 03/20/2016 1523   ALBUMIN 4.1 03/20/2016 1523   AST 16 03/20/2016 1523   ALT 15 03/20/2016 1523   ALKPHOS 110 03/20/2016 1523   BILITOT <0.2 03/20/2016 1523   GFRNONAA 80 03/20/2016 1523   GFRAA 93 03/20/2016 1523     Assessment/Plan:   GERD Dysphagia Dysphagia to solids (mainly bread) with history of dysmotility and has had dilations in the past.  GERD despite pantoprazole 40 Mg once daily.  DDx includes esophagitis, gastritis, PUD as well as stricture/dysmotility - Schedule EGD with dilation - I thoroughly discussed the procedure with the patient (at bedside) to include nature of the procedure, alternatives, benefits, and risks (including but not limited to bleeding, infection, perforation, anesthesia/cardiac pulmonary complications).  Patient verbalized understanding and gave verbal consent to proceed with procedure. - Consider increasing pantoprazole to twice daily after  EGD  Rectal bleeding Hemorrhoids History of prolapsed hemorrhoids on last colonoscopy.  Also history of anal  fissure treated with nitroglycerin.  Recurrent rectal bleeding similar to 2021 which resolved with nitroglycerin. - Refilled nitroglycerin - After colonoscopy consider hemorrhoid banding for persistent internal hemorrhoids that could also be the source of bleeding. - Rectal exam during colonoscopy  History of colon polyps Several tubular adenomas on colonoscopy in 2021 with 3-year repeat - Schedule colonoscopy - I thoroughly discussed the procedure with the patient (at bedside) to include nature of the procedure, alternatives, benefits, and risks (including but not limited to bleeding, infection, perforation, anesthesia/cardiac pulmonary complications).  Patient verbalized understanding and gave verbal consent to proceed with procedure.   Lara Mulch [facility] 06/22/2023, 2:28 PM  Cc: Richmond Campbell., PA-C

## 2023-06-22 NOTE — Patient Instructions (Addendum)
 We have sent a prescription for nitroglycerin 0.125% gel to [facility]. You should apply a pea size amount to your rectum three times daily x 6-8 weeks.  [facility]'s information is below: Address: [address], [place], [place] [address]  [phone]  *Please DO NOT go directly from our office to pick up this medication! Give the pharmacy 1 day to process the prescription as this is compounded and takes time to make.   You have been scheduled for an endoscopy and colonoscopy. Please follow the written instructions given to you at your visit today.  If you use inhalers (even only as needed), please bring them with you on the day of your procedure.  DO NOT TAKE 7 DAYS PRIOR TO TEST- Trulicity (dulaglutide) Ozempic, Wegovy (semaglutide) Mounjaro (tirzepatide) Bydureon Bcise (exanatide extended release)  DO NOT TAKE 1 DAY PRIOR TO YOUR TEST Rybelsus (semaglutide) Adlyxin (lixisenatide) Victoza (liraglutide) Byetta (exanatide) ___________________________________________________________________________  Bonita Quin will receive your bowel preparation through [facility], which ensures the lowest copay and home delivery, with outreach via text or call from an [phone] number. Please respond promptly to avoid rescheduling of your procedure. If you are interested in alternative options or have any questions regarding your prep, please contact them at [phone] ____________________________________________________________________________  Your Provider Has Sent Your Bowel Prep Regimen To [facility]   [facility] will contact you to verify your information and collect your copay, if applicable. Enjoy the comfort of your home while your prescription is mailed to you, FREE of any shipping charges.   [facility] accepts all major insurance benefits and applies discounts & coupons.  Have additional questions?   Chat: [email or web address] Call: [phone] Email:  [email or web address] NCPDP: [id number]  How will [facility] contact you?  With a Welcome phone call,  a Welcome text and a checkout link in text form.  Texts you receive from [phone] Are NOT Spam.  *To set up delivery, you must complete the checkout process via link or speak to one of the patient care representatives. If [facility] is unable to reach you, your prescription may be delayed.  To avoid long hold times on the phone, you may also utilize the secure chat feature on the [facility] website to request that they call you back for transaction completion or to expedite your concerns.  _______________________________________________________  If your blood pressure at your visit was 140/90 or greater, please contact your primary care physician to follow up on this.  _______________________________________________________  If you are age 57 or older, your body mass index should be between 23-30. Your Body mass index is 26.09 kg/m. If this is out of the aforementioned range listed, please consider follow up with your Primary Care Provider.  If you are age 62 or younger, your body mass index should be between 19-25. Your Body mass index is 26.09 kg/m. If this is out of the aformentioned range listed, please consider follow up with your Primary Care Provider.   ________________________________________________________  The [place] [facility] providers would like to encourage you to use [facility] to communicate with providers for non-urgent requests or questions.  Due to long hold times on the telephone, sending your provider a message by [facility] may be a faster and more efficient way to get a response.  Please allow 48 business hours for a response.  Please remember that this is for non-urgent requests.  _______________________________________________________

## 2023-06-25 ENCOUNTER — Ambulatory Visit: Payer: PPO | Admitting: Podiatry

## 2023-07-01 ENCOUNTER — Ambulatory Visit: Payer: PPO | Admitting: Podiatry

## 2023-07-01 ENCOUNTER — Encounter: Payer: Self-pay | Admitting: Podiatry

## 2023-07-01 ENCOUNTER — Ambulatory Visit (INDEPENDENT_AMBULATORY_CARE_PROVIDER_SITE_OTHER): Payer: PPO

## 2023-07-01 DIAGNOSIS — M722 Plantar fascial fibromatosis: Secondary | ICD-10-CM | POA: Diagnosis not present

## 2023-07-01 MED ORDER — TRIAMCINOLONE ACETONIDE 10 MG/ML IJ SUSP
10.0000 mg | Freq: Once | INTRAMUSCULAR | Status: AC
  Administered 2023-07-01: 10 mg via INTRA_ARTICULAR

## 2023-07-02 NOTE — Progress Notes (Signed)
 Subjective:   Patient ID: Gloria Atkins, female   DOB: 78 y.o.   MRN: [id number]   HPI Patient presents stating that she has pain in both of her feet and the heels.  States it started in the left and is now moved to the right and they have both become intensely sore over the last month   ROS      Objective:  Physical Exam  Neurovascular status intact with inflammation around the medial aspect of the plantar fascia at the insertion to the calcaneus     Assessment:  Acute plantar fasciitis bilateral left over right     Plan:  Sterile prep injected the fascia at insertion 3 mg Kenalog 5 mg Xylocaine applied sterile dressing and advised on support shoes and stretching exercises  X-rays indicate small spur formation bilateral no indication of stress fracture arthritis

## 2023-08-04 ENCOUNTER — Encounter: Payer: Self-pay | Admitting: Gastroenterology

## 2023-08-11 ENCOUNTER — Encounter: Payer: Self-pay | Admitting: Certified Registered Nurse Anesthetist

## 2023-08-13 ENCOUNTER — Encounter: Payer: Self-pay | Admitting: Gastroenterology

## 2023-08-13 ENCOUNTER — Ambulatory Visit: Payer: PPO | Admitting: Gastroenterology

## 2023-08-13 VITALS — BP 118/67 | HR 69 | Temp 97.5°F | Resp 13 | Ht 64.0 in | Wt 152.0 lb

## 2023-08-13 DIAGNOSIS — K449 Diaphragmatic hernia without obstruction or gangrene: Secondary | ICD-10-CM

## 2023-08-13 DIAGNOSIS — Z1211 Encounter for screening for malignant neoplasm of colon: Secondary | ICD-10-CM

## 2023-08-13 DIAGNOSIS — K5730 Diverticulosis of large intestine without perforation or abscess without bleeding: Secondary | ICD-10-CM

## 2023-08-13 DIAGNOSIS — D123 Benign neoplasm of transverse colon: Secondary | ICD-10-CM

## 2023-08-13 DIAGNOSIS — D122 Benign neoplasm of ascending colon: Secondary | ICD-10-CM

## 2023-08-13 DIAGNOSIS — K222 Esophageal obstruction: Secondary | ICD-10-CM

## 2023-08-13 DIAGNOSIS — K635 Polyp of colon: Secondary | ICD-10-CM

## 2023-08-13 DIAGNOSIS — R1310 Dysphagia, unspecified: Secondary | ICD-10-CM

## 2023-08-13 DIAGNOSIS — K648 Other hemorrhoids: Secondary | ICD-10-CM | POA: Diagnosis not present

## 2023-08-13 DIAGNOSIS — K644 Residual hemorrhoidal skin tags: Secondary | ICD-10-CM

## 2023-08-13 DIAGNOSIS — Z860101 Personal history of adenomatous and serrated colon polyps: Secondary | ICD-10-CM

## 2023-08-13 DIAGNOSIS — K219 Gastro-esophageal reflux disease without esophagitis: Secondary | ICD-10-CM

## 2023-08-13 DIAGNOSIS — Z860100 Personal history of colon polyps, unspecified: Secondary | ICD-10-CM

## 2023-08-13 MED ORDER — SODIUM CHLORIDE 0.9 % IV SOLN: 500.0000 mL | Freq: Once | INTRAVENOUS | Status: DC

## 2023-08-13 NOTE — Progress Notes (Signed)
0802 Robinul 0.1 mg IV given due large amount of secretions upon assessment.  MD made aware, vss 

## 2023-08-13 NOTE — Progress Notes (Signed)
 [facility] History and Physical   Primary Care Physician:  Richmond Campbell., PA-C   Reason for Procedure:  H/o multiple adenomatous colon polyps, dysphagia and GERD  Plan:    EGD and colonoscopy with possible interventions as needed     HPI: Gloria Atkins is a very pleasant 78 y.o. female here for EGD and colonoscopy for h/o adenomatous colon polyps, dysphagia and GERD.   The risks and benefits as well as alternatives of endoscopic procedure(s) have been discussed and reviewed. All questions answered. The patient agrees to proceed.    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   stretching-improved swallowing since    Gallstones    GERD (gastroesophageal reflux disease)    Heart palpitations    Negative event monitor 4/12   Heartburn    Hyperlipidemia    Hyperplastic colon polyp    Hypothyroidism    thyroid removed   Nausea    Osteopenia    Osteoporosis     Past Surgical History:  Procedure Laterality Date   ABDOMINAL HYSTERECTOMY  1980   ANAL FISSURE REPAIR     BREAST REDUCTION SURGERY Bilateral 2016   CHOLECYSTECTOMY  1970's   COLONOSCOPY  05/2012   CYSTOSCOPY N/A 08/05/2012   Procedure: CYSTOSCOPY BLADDER BIOPSY ;  Surgeon: Anner Crete, MD;  Location: [facility];  Service: Urology;  Laterality: N/A;   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 Authorizing Provider  aspirin EC 81 MG EC tablet Take 81 mg by mouth. OCCASIONALLY   Yes  [provider]  Calcium Carb-Cholecalciferol (CALCIUM 500 + D PO) Take by mouth.   Yes [provider]  cholecalciferol (VITAMIN D3) 25 MCG (1000 UNIT) tablet Take by mouth. 06/11/18  Yes [provider]  Cyanocobalamin 2000 MCG TBCR Take by mouth. 06/11/18  Yes [provider]  fluticasone (FLONASE) 50 MCG/ACT nasal spray Place 2 sprays into the nose. 08/04/23 08/03/24 Yes [provider]  levothyroxine (SYNTHROID, LEVOTHROID) 88 MCG tablet Take 88 mcg by mouth daily. Takes every other day and the off days   Yes [provider]  montelukast (SINGULAIR) 10 MG tablet Take by mouth. 09/03/22  Yes [provider]  Multiple Vitamins-Minerals (MULTIVITAMIN ADULTS PO) Take 1 tablet by mouth daily. 10/19/12  Yes [provider]  oxyCODONE (OXY IR/ROXICODONE) 5 MG immediate release tablet Take by mouth. 11/02/22  Yes [provider]  AMBULATORY NON FORMULARY MEDICATION Medication Name: Nitroglycerin 0.125% gel - apply a pea size amount to your rectum three times daily x 6-8 weeks. Patient not taking: Reported on 08/13/2023 06/22/23   Boone Master M, PA-C  pantoprazole (PROTONIX) 40 MG tablet 40 mg.    [provider]  PROLIA 60 MG/ML SOSY injection Inject into the skin. 03/04/22   [provider]    Current Outpatient Medications  Medication Sig Dispense Refill  aspirin EC 81 MG EC tablet Take 81 mg by mouth. OCCASIONALLY     Calcium Carb-Cholecalciferol (CALCIUM 500 + D PO) Take by mouth.     cholecalciferol (VITAMIN D3) 25 MCG (1000 UNIT) tablet Take by mouth.     Cyanocobalamin 2000 MCG TBCR Take by mouth.     fluticasone (FLONASE) 50 MCG/ACT nasal spray Place 2 sprays into the nose.     levothyroxine (SYNTHROID, LEVOTHROID) 88 MCG tablet Take 88 mcg by mouth daily. Takes every other day and the off days     montelukast (SINGULAIR) 10 MG tablet Take by mouth.     Multiple  Vitamins-Minerals (MULTIVITAMIN ADULTS PO) Take 1 tablet by mouth daily.     oxyCODONE (OXY IR/ROXICODONE) 5 MG immediate release tablet Take by mouth.     AMBULATORY NON FORMULARY MEDICATION Medication Name: Nitroglycerin 0.125% gel - apply a pea size amount to your rectum three times daily x 6-8 weeks. (Patient not taking: Reported on 08/13/2023) 45 g 1   pantoprazole (PROTONIX) 40 MG tablet 40 mg.     PROLIA 60 MG/ML SOSY injection Inject into the skin.     Current Facility-Administered Medications  Medication Dose Route Frequency Provider Last Rate Last Admin   0.9 %  sodium chloride infusion  500 mL Intravenous Once Napoleon Form, MD        Allergies as of 08/13/2023 - Review Complete 08/13/2023  Allergen Reaction Noted   Fosamax [alendronate] Diarrhea 08/29/2015   Denosumab Other (See Comments) 02/21/2020   Ibandronate Other (See Comments) 09/09/2017   Phentermine Other (See Comments) 02/28/2021    Family History  Problem Relation Age of Onset   Gallbladder disease Mother    Osteoporosis Mother    Diverticulitis Mother    Coronary artery disease Father    Heart attack Father    Hypertension Father    Breast cancer Sister 47   Diverticulitis Sister    Diverticulitis Sister    Diverticulitis Sister    Alcoholism Brother    Colon cancer Neg Hx    Esophageal cancer Neg Hx    Liver disease Neg Hx     Social History   Socioeconomic History   Marital status: Single    Spouse name: Not on file   Number of children: 2   Years of education: 12   Highest education level: Not on file  Occupational History   Occupation: CNA-retired    Comment: [employer]  Tobacco Use   Smoking status: Never   Smokeless tobacco: Never  Vaping Use   Vaping status: Never Used  Substance and Sexual Activity   Alcohol use: Not Currently    Comment: occasional -once or twice a week   Drug use: No   Sexual activity: Not Currently    Birth control/protection: Post-menopausal  Other  Topics Concern   Not on file  Social History Narrative   Lives alone   Caffeine use: Sweet tea daily   Social Drivers of Corporate investment banker Strain: Not on file  Food Insecurity: Low Risk  (08/04/2023)   Received from [facility]   Hunger Vital Sign    Worried About Running Out of Food in the Last Year: Never true    Ran Out of Food in the Last Year: Never true  Transportation Needs: No Transportation Needs (08/04/2023)   Received from [facility]    In the past 12 months, has lack of reliable transportation kept you from  medical appointments, meetings, work or from getting things needed for daily living? : No  Physical Activity: Not on file  Stress: Not on file  Social Connections: Not on file  Intimate Partner Violence: Not At Risk (10/28/2022)   Humiliation, Afraid, Rape, and Kick questionnaire    Fear of Current or Ex-Partner: No    Emotionally Abused: No    Physically Abused: No    Sexually Abused: No    Review of Systems:  All other review of systems negative except as mentioned in the HPI.  Physical Exam: Vital signs in last 24 hours: BP (!) 166/99   Pulse 80   Temp (!) 97.5 F (36.4 C)   Resp 13   Ht 5\' 4"  (1.626 m)   Wt 152 lb (68.9 kg)   SpO2 95%   BMI 26.09 kg/m  General:   Alert, NAD Lungs:  Clear .   Heart:  Regular rate and rhythm Abdomen:  Soft, nontender and nondistended. Neuro/Psych:  Alert and cooperative. Normal mood and affect. A and O x 3  Reviewed labs, radiology imaging, old records and pertinent past GI work up  Patient is appropriate for planned procedure(s) and anesthesia in an ambulatory setting   K. Scherry Ran , MD [phone]

## 2023-08-13 NOTE — Op Note (Signed)
 [facility] Patient Name: Gloria Atkins Procedure Date: 08/13/2023 8:49 AM MRN: [id number] Endoscopist: Napoleon Form , MD, [id number] Age: 78 Referring MD:  Date of Birth: May 21, 1945 Gender: Female Account #: [id number] Procedure:                Upper GI endoscopy Indications:              Dysphagia, Esophageal reflux symptoms that persist                            despite appropriate therapy Medicines:                Monitored Anesthesia Care Procedure:                Pre-Anesthesia Assessment:                           - Prior to the procedure, a History and Physical                            was performed, and patient medications and                            allergies were reviewed. The patient's tolerance of                            previous anesthesia was also reviewed. The risks                            and benefits of the procedure and the sedation                            options and risks were discussed with the patient.                            All questions were answered, and informed consent                            was obtained. Prior Anticoagulants: The patient has                            taken no anticoagulant or antiplatelet agents. ASA                            Grade Assessment: II - A patient with mild systemic                            disease. After reviewing the risks and benefits,                            the patient was deemed in satisfactory condition to                            undergo the procedure.  After obtaining informed consent, the endoscope was                            passed under direct vision. Throughout the                            procedure, the patient's blood pressure, pulse, and                            oxygen saturations were monitored continuously. The                            Olympus scope [id number] was introduced through the                            mouth, and  advanced to the second part of duodenum.                            The upper GI endoscopy was accomplished without                            difficulty. The patient tolerated the procedure                            well. Scope In: Scope Out: Findings:                 One benign-appearing, intrinsic mild stenosis was                            found 32 to 33 cm from the incisors. This stenosis                            measured less than one cm (in length). The stenosis                            was traversed. A TTS dilator was passed through the                            scope. Dilation with an 18-19-20 mm x 8 cm CRE                            balloon dilator was performed to 20 mm. The                            dilation site was examined following endoscope                            reinsertion and showed no change.                           A 5 cm hiatal hernia was present.  The exam of the stomach was otherwise normal.                           The examined duodenum was normal. Complications:            No immediate complications. Estimated Blood Loss:     Estimated blood loss: none. Impression:               - Benign-appearing esophageal stenosis. Dilated.                           - 5 cm hiatal hernia.                           - Normal examined duodenum.                           - No specimens collected. Recommendation:           - Patient has a contact number available for                            emergencies. The signs and symptoms of potential                            delayed complications were discussed with the                            patient. Return to normal activities tomorrow.                            Written discharge instructions were provided to the                            patient.                           - Resume previous diet.                           - Continue present medications.                           - Follow  an antireflux regimen. Napoleon Form, MD 08/13/2023 9:47:03 AM This report has been signed electronically.

## 2023-08-13 NOTE — Patient Instructions (Addendum)
 Resume previous diet and medications.  Education attached on hernia and diverticulosis.  Handouts provided on hemorrhoids and hemorrhoid banding.  Hemorrhoid banding appointment made in July.  If you need to reschedule the appointment, please call the office directly.  Repeat colonoscopy is not recommended at this time.     YOU HAD AN ENDOSCOPIC PROCEDURE TODAY AT THE [facility]:   Refer to the procedure report that was given to you for any specific questions about what was found during the examination.  If the procedure report does not answer your questions, please call your gastroenterologist to clarify.  If you requested that your care partner not be given the details of your procedure findings, then the procedure report has been included in a sealed envelope for you to review at your convenience later.  YOU SHOULD EXPECT: Some feelings of bloating in the abdomen. Passage of more gas than usual.  Walking can help get rid of the air that was put into your GI tract during the procedure and reduce the bloating. If you had a lower endoscopy (such as a colonoscopy or flexible sigmoidoscopy) you may notice spotting of blood in your stool or on the toilet paper. If you underwent a bowel prep for your procedure, you may not have a normal bowel movement for a few days.  Please Note:  You might notice some irritation and congestion in your nose or some drainage.  This is from the oxygen used during your procedure.  There is no need for concern and it should clear up in a day or so.  SYMPTOMS TO REPORT IMMEDIATELY:  Following lower endoscopy (colonoscopy or flexible sigmoidoscopy):  Excessive amounts of blood in the stool  Significant tenderness or worsening of abdominal pains  Swelling of the abdomen that is new, acute  Fever of 100F or higher  Following upper endoscopy (EGD)  Vomiting of blood or coffee ground material  New chest pain or pain under the shoulder blades  Painful or  persistently difficult swallowing  New shortness of breath  Fever of 100F or higher  Black, tarry-looking stools  For urgent or emergent issues, a gastroenterologist can be reached at any hour by calling [phone]. Do not use MyChart messaging for urgent concerns.    DIET:  We do recommend a small meal at first, but then you may proceed to your regular diet.  Drink plenty of fluids but you should avoid alcoholic beverages for 24 hours.  ACTIVITY:  You should plan to take it easy for the rest of today and you should NOT DRIVE or use heavy machinery until tomorrow (because of the sedation medicines used during the test).    FOLLOW UP: Our staff will call the number listed on your records the next business day following your procedure.  We will call around 7:15- 8:00 am to check on you and address any questions or concerns that you may have regarding the information given to you following your procedure. If we do not reach you, we will leave a message.     If any biopsies were taken you will be contacted by phone or by letter within the next 1-3 weeks.  Please call us at [phone] if you have not heard about the biopsies in 3 weeks.    SIGNATURES/CONFIDENTIALITY: You and/or your care partner have signed paperwork which will be entered into your electronic medical record.  These signatures attest to the fact that that the information above on your After Visit Summary  has been reviewed and is understood.  Full responsibility of the confidentiality of this discharge information lies with you and/or your care-partner.

## 2023-08-13 NOTE — Progress Notes (Signed)
 Report given to PACU, vss

## 2023-08-13 NOTE — Op Note (Signed)
 [facility] Patient Name: Gloria Atkins Procedure Date: 08/13/2023 8:48 AM MRN: [id number] Endoscopist: Napoleon Form , MD, [id number] Age: 78 Referring MD:  Date of Birth: [birth year] Gender: Female Account #: [id number] Procedure:                Colonoscopy Indications:              High risk colon cancer surveillance: Personal                            history of adenoma (10 mm or greater in size), High                            risk colon cancer surveillance: Personal history of                            multiple (3 or more) adenomas Medicines:                Monitored Anesthesia Care Procedure:                Pre-Anesthesia Assessment:                           - Prior to the procedure, a History and Physical                            was performed, and patient medications and                            allergies were reviewed. The patient's tolerance of                            previous anesthesia was also reviewed. The risks                            and benefits of the procedure and the sedation                            options and risks were discussed with the patient.                            All questions were answered, and informed consent                            was obtained. Prior Anticoagulants: The patient has                            taken no anticoagulant or antiplatelet agents. ASA                            Grade Assessment: II - A patient with mild systemic                            disease. After reviewing the risks and benefits,  the patient was deemed in satisfactory condition to                            undergo the procedure.                           After obtaining informed consent, the colonoscope                            was passed under direct vision. Throughout the                            procedure, the patient's blood pressure, pulse, and                            oxygen saturations were  monitored continuously. The                            PCF-HQ190L Colonoscope [id number] was introduced                            through the anus and advanced to the the cecum,                            identified by appendiceal orifice and ileocecal                            valve. The colonoscopy was performed without                            difficulty. The patient tolerated the procedure                            well. The quality of the bowel preparation was                            good. The ileocecal valve, appendiceal orifice, and                            rectum were photographed. Scope In: 9:16:44 AM Scope Out: 9:32:09 AM Scope Withdrawal Time: 0 hours 8 minutes 52 seconds  Total Procedure Duration: 0 hours 15 minutes 25 seconds  Findings:                 Hemorrhoids, prolapsed were found on perianal exam.                           Two sessile polyps were found in the transverse                            colon and ascending colon. The polyps were 4 to 7                            mm in size. These polyps were removed with a cold  snare. Resection and retrieval were complete.                           Scattered medium-mouthed and small-mouthed                            diverticula were found in the sigmoid colon and                            descending colon.                           Non-bleeding external and internal hemorrhoids were                            found during retroflexion. The hemorrhoids were                            large. Complications:            No immediate complications. Estimated Blood Loss:     Estimated blood loss was minimal. Impression:               - Hemorrhoids found on perianal exam.                           - Two 4 to 7 mm polyps in the transverse colon and                            in the ascending colon, removed with a cold snare.                            Resected and retrieved.                            - Diverticulosis in the sigmoid colon and in the                            descending colon.                           - Non-bleeding external and internal hemorrhoids. Recommendation:           - Resume previous diet.                           - Continue present medications.                           - Await pathology results.                           - No repeat colonoscopy due to age.                           - Return to GI clinic at the next available  appointment for hemorrhoid band ligation. Napoleon Form, MD 08/13/2023 9:44:18 AM This report has been signed electronically.

## 2023-08-17 LAB — SURGICAL PATHOLOGY

## 2023-08-18 ENCOUNTER — Telehealth: Payer: Self-pay | Admitting: *Deleted

## 2023-08-18 NOTE — Telephone Encounter (Signed)
 Post procedure follow up call placed, no answer and left VM.

## 2023-09-17 ENCOUNTER — Ambulatory Visit: Payer: Self-pay | Admitting: Gastroenterology

## 2023-09-23 ENCOUNTER — Encounter: Payer: Self-pay | Admitting: Podiatry

## 2023-09-23 ENCOUNTER — Ambulatory Visit (INDEPENDENT_AMBULATORY_CARE_PROVIDER_SITE_OTHER): Admitting: Podiatry

## 2023-09-23 VITALS — Ht 64.0 in | Wt 152.0 lb

## 2023-09-23 DIAGNOSIS — L600 Ingrowing nail: Secondary | ICD-10-CM | POA: Diagnosis not present

## 2023-09-23 NOTE — Patient Instructions (Signed)

## 2023-09-24 NOTE — Progress Notes (Signed)
 Subjective:   Patient ID: Gloria Atkins, female   DOB: 78 y.o.   MRN: [id number]   HPI Patient presents with a lot of pain with the big toenail left stating that it has been hard to wear shoe gear with and she has tried to trim soak it without relief of symptoms and does not remember damage neuro vas   ROS      Objective:  Physical Exam  Scaler status intact with a relative thin left big toenail that is painful from dorsal pressure with no erythema edema drainage noted     Assessment:  Toenail with damaged left hallux nail bed painful     Plan:  H&P reviewed and recommended nail removal explained procedure risk patient wants surgery and at this point I went ahead and I allowed her to read then signed consent form.  I infiltrated the left big toe 60 mg like Marcaine mixture sterile prep done using sterile instrumentation removed the hallux nail exposed matrix applied phenol for applications 30 seconds followed by alcohol lavage sterile dressing gave instructions on soaks wear dressing 24 hours take it off earlier if throbbing were to occur

## 2023-11-18 ENCOUNTER — Encounter: Admitting: Gastroenterology

## 2023-11-18 ENCOUNTER — Ambulatory Visit: Admitting: Gastroenterology

## 2023-11-18 ENCOUNTER — Encounter: Payer: Self-pay | Admitting: Gastroenterology

## 2023-11-18 VITALS — BP 122/72 | HR 78 | Ht 64.0 in | Wt 152.0 lb

## 2023-11-18 DIAGNOSIS — K642 Third degree hemorrhoids: Secondary | ICD-10-CM

## 2023-11-18 DIAGNOSIS — K641 Second degree hemorrhoids: Secondary | ICD-10-CM

## 2023-11-18 NOTE — Progress Notes (Signed)
 PROCEDURE NOTE: The patient presents with symptomatic grade II-III  hemorrhoids, requesting rubber band ligation of his/her hemorrhoidal disease.  All risks, benefits and alternative forms of therapy were described and informed consent was obtained.  In the Left Lateral Decubitus position anoscopic examination revealed grade II-III hemorrhoids in the left lateral, right posterior and right anterior position(s).  The anorectum was pre-medicated with 0.125% nitroglycerin and RectiCare The decision was made to band the left lateral internal hemorrhoid, and the [facility] O'Regan System was used to perform band ligation without complication.  Digital anorectal examination was then performed to assure proper positioning of the band, and to adjust the banded tissue as required.  The patient was discharged home without pain or other issues.  Dietary and behavioral recommendations were given and along with follow-up instructions.      The patient will return in 3-4 weeks for  follow-up and possible additional banding as required. No complications were encountered and the patient tolerated the procedure well.   LOIS Wilkie Mcgee , MD [phone]

## 2023-11-18 NOTE — Patient Instructions (Signed)

## 2023-12-01 ENCOUNTER — Encounter: Payer: Self-pay | Admitting: Gastroenterology

## 2023-12-01 ENCOUNTER — Ambulatory Visit: Admitting: Gastroenterology

## 2023-12-01 VITALS — BP 122/70 | HR 70 | Ht 64.0 in | Wt 153.0 lb

## 2023-12-01 DIAGNOSIS — K641 Second degree hemorrhoids: Secondary | ICD-10-CM

## 2023-12-01 NOTE — Patient Instructions (Signed)

## 2023-12-01 NOTE — Progress Notes (Signed)
 PROCEDURE NOTE: The patient presents with symptomatic grade II  hemorrhoids, requesting rubber band ligation of his/her hemorrhoidal disease.  All risks, benefits and alternative forms of therapy were described and informed consent was obtained.   The anorectum was pre-medicated with 0.125% NTG and Recticare The decision was made to band the right posterior internal hemorrhoid, and the [facility] O'Regan System was used to perform band ligation without complication.  Digital anorectal examination was then performed to assure proper positioning of the band, and to adjust the banded tissue as required.  The patient was discharged home without pain or other issues.  Dietary and behavioral recommendations were given and along with follow-up instructions.       The patient will return in 4-6 weeks for  follow-up and possible additional banding as required. No complications were encountered and the patient tolerated the procedure well.

## 2023-12-04 ENCOUNTER — Telehealth: Payer: Self-pay | Admitting: Gastroenterology

## 2023-12-04 NOTE — Telephone Encounter (Signed)
 Inbound call from patient stating that she had a hem band has been having pain ever since. Patient is requesting a call back to discuss. Please advise.

## 2023-12-04 NOTE — Telephone Encounter (Signed)
 Patient describes a burning pressure of her rectum since her banding procedure on 12/01/23. Today it has eased up a little since she had her bowel movement. Stool was formed at first then ended in loose stool. She took Ibuprofen last night and was able to sleep well. She is drinking her water  and also takes daily Metamucil. No rectal bleeding. Please advise.

## 2023-12-08 NOTE — Telephone Encounter (Signed)
 Patient reports sharp pain with passage of stool. Pain is less intense after stool passes but does not go away. Sitz bath did not help. She is status post hemorrhoid banding 1 week. Please advise.

## 2023-12-08 NOTE — Telephone Encounter (Signed)
 Patient advised. No openings this week. Patient scheduled for first opening next week.

## 2023-12-08 NOTE — Telephone Encounter (Signed)
 Called the patient back. No answer. Left message of my call.

## 2023-12-08 NOTE — Telephone Encounter (Signed)
 Patient calling in regards to previous note. Please advise.   Thank you

## 2023-12-10 NOTE — Telephone Encounter (Signed)
 Spoke with the patient. She is not feeling any improvement. Appointment moved to 12/11/23 with Delon Failing, PA. Canceled the appointment with Dr Shila on 12/15/23. Patient is scheduled for a repeat banding procedure on 12/23/23. I will ask the provider to determine if that needs to be rescheduled.

## 2023-12-11 ENCOUNTER — Ambulatory Visit: Admitting: Physician Assistant

## 2023-12-11 ENCOUNTER — Encounter: Payer: Self-pay | Admitting: Physician Assistant

## 2023-12-11 VITALS — BP 110/70 | HR 72 | Ht 62.0 in | Wt 153.4 lb

## 2023-12-11 DIAGNOSIS — K6289 Other specified diseases of anus and rectum: Secondary | ICD-10-CM | POA: Diagnosis not present

## 2023-12-11 DIAGNOSIS — K602 Anal fissure, unspecified: Secondary | ICD-10-CM

## 2023-12-11 DIAGNOSIS — K641 Second degree hemorrhoids: Secondary | ICD-10-CM | POA: Diagnosis not present

## 2023-12-11 MED ORDER — AMBULATORY NON FORMULARY MEDICATION
1 refills | Status: DC
Start: 2023-12-11 — End: 2024-02-10

## 2023-12-11 NOTE — Progress Notes (Signed)
 Chief Complaint: Rectal pain  HPI:    Gloria Atkins is a 78 year old female with a past medical history as listed below including anal fissure, esophageal dysmotility, GERD and hemorrhoids, known to Dr. Shila from recent hemorrhoid banding, who presents to clinic today for follow-up after banding with rectal pain.    10/2019 office visit with Dr. Nandigam for rectal bleeding secondary to anal fissure and given nitroglycerin 0.25% set up for colonoscopy.    11/18/2019 colonoscopy with hemorrhoids, three 5-11 mm polyps in the ascending colon, two 1-2 mm polyps in the ascending colon, diverticulosis, prolapsed internal hemorrhoids.  Biopsy showed 5 tubular adenomas and one inflammatory polyp.  Repeat recommended in 3 years.    06/22/23 patient seen in clinic by Gloria Blower, PA-C at that time discussed dysphagia and GERD.  Also rectal bleeding.  At that time patient declined a rectal exam and deferred to colonoscopy.  Patient is scheduled for an EGD with dilation.   4 /10/25 EGD with benign-appearing esophageal stenosis dilated to 5 cm hiatal hernia within normal duodenum.    08/13/23 colonoscopy with hemorrhoids, two 4-7 mm polyps in the transverse and ascending colon diverticulosis in the sigmoid and descending colon as well as nonbleeding external and internal hemorrhoids.  Patient set up for hemorrhoid banding.    11/18/23 and 12/01/2023 patient had hemorrhoid banding.    12/04/2023 patient called in describing some pain ever since her banding.  This eased after having a bowel movement.  Patient then reported sharp pain with passage of stool on 12/08/2023.  At that point told to start RectiCare with lidocaine .    Today, patient tells me that she has had pain ever since her last banding.  Describes this as a sharp pain worse than her last fissure.  Apparently this all worsened after she had her first bowel movement after banding and now she has some decreased bowel movements just because she is scared to  pass a stool due to discomfort.  She has been using the RectiCare with lidocaine  which does help for a little while.  Otherwise passing soft normal stools.  Did see a little bit of bright red blood initially but this is since stopped.    Denies fever, chills, weight loss, nausea or vomiting.  PREVIOUS GI WORKUP    Colonoscopy 06/11/2012 Diverticulosis otherwise normal exam   Colonoscopy May 21, 2005: Diminutive polyp removed from sigmoid, Sigmoid diverticulitis   Colonoscopy August 22, 2007: Normal   EGD June 02, 2007: Normal   EGD June 11, 2012    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   stretching-improved swallowing since    Gallstones    GERD (gastroesophageal reflux disease)    Heart palpitations    Negative event monitor 4/12   Heartburn    Hyperlipidemia    Hyperplastic colon polyp    Hypothyroidism    thyroid removed   Nausea    Osteopenia    Osteoporosis     Past Surgical History:  Procedure Laterality Date   ABDOMINAL HYSTERECTOMY  1980   ANAL FISSURE REPAIR     BREAST REDUCTION SURGERY Bilateral 2016   CHOLECYSTECTOMY  1970's   COLONOSCOPY  05/2012   CYSTOSCOPY N/A 08/05/2012  Procedure: CYSTOSCOPY BLADDER BIOPSY ;  Surgeon: Norleen JINNY Seltzer, MD;  Location: [facility];  Service: Urology;  Laterality: N/A;   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 OCCASIONALLY     Calcium Carb-Cholecalciferol  (CALCIUM 500 + D PO) Take by mouth.     cholecalciferol (VITAMIN D3) 25 MCG (1000 UNIT) tablet Take by mouth.     Cyanocobalamin 2000 MCG TBCR Take by mouth.     docusate sodium  (COLACE) 100 MG capsule Take 200 mg by mouth daily.     Hydrocortisone (PREPARATION H EX) Apply 1 Application topically as needed. With Lidocaine      levothyroxine  (SYNTHROID , LEVOTHROID) 88 MCG tablet Take 88 mcg by mouth daily. Takes 88mcg every other day and 100mcg the off days     montelukast (SINGULAIR) 10 MG tablet Take by mouth.     Multiple Vitamins-Minerals (MULTIVITAMIN ADULTS PO) Take 1 tablet by mouth daily.     oxyCODONE  (OXY IR/ROXICODONE ) 5 MG immediate release tablet Take by mouth.     pantoprazole  (PROTONIX ) 40 MG tablet 40 mg.     PROLIA 60 MG/ML SOSY injection Inject into the skin.     No current facility-administered medications for this visit.    Allergies as of 12/11/2023 - Review Complete 12/11/2023  Allergen Reaction Noted   Fosamax [alendronate] Diarrhea 08/29/2015   Denosumab Other (See Comments) 02/21/2020   Ibandronate Other (See Comments) 09/09/2017   Phentermine Other (See Comments) 02/28/2021    Family History  Problem Relation Age of Onset   Gallbladder disease Mother    Osteoporosis Mother    Diverticulitis Mother    Coronary artery disease Father    Heart attack Father    Hypertension Father    Breast cancer Sister 41   Diverticulitis Sister    Diverticulitis Sister    Diverticulitis Sister    Alcoholism Brother    Colon cancer Neg Hx    Esophageal cancer Neg Hx    Liver disease Neg Hx     Social History   Socioeconomic History   Marital status: Single    Spouse name: Not on file   Number of children: 2   Years of education: 12   Highest education level: Not on file  Occupational History   Occupation: CNA-retired    Comment: [employer]  Tobacco Use   Smoking status: Never   Smokeless tobacco: Never  Vaping Use   Vaping status: Never Used  Substance  and Sexual Activity   Alcohol use: Not Currently    Comment: occasional -once or twice a week   Drug use: No   Sexual activity: Not Currently    Birth control/protection: Post-menopausal  Other Topics Concern   Not on file  Social History Narrative   Lives alone   Caffeine use: Sweet tea daily   Social Drivers of Health   Financial Resource Strain: Not on file  Food Insecurity: Low Risk  (08/04/2023)   Received from [facility]   Hunger Vital Sign    Within the past 12 months, you worried that your food would run out before you got money to  buy more: Never true    Within the past 12 months, the food you bought just didn't last and you didn't have money to get more. : Never true  Transportation Needs: No Transportation Needs (08/04/2023)   Received from [facility]    In the past 12 months, has lack of reliable transportation kept you from medical appointments, meetings, work or from getting things needed for daily living? : No  Physical Activity: Not on file  Stress: Not on file  Social Connections: Not on file  Intimate Partner Violence: Not At Risk (10/28/2022)   Humiliation, Afraid, Rape, and Kick questionnaire    Fear of Current or Ex-Partner: No    Emotionally Abused: No    Physically Abused: No    Sexually Abused: No    Review of Systems:    Constitutional: No weight loss, fever or chills Cardiovascular: No chest pain  Respiratory: No SOB Gastrointestinal: See HPI and otherwise negative   Physical Exam:  Vital signs: BP 110/70 (BP Location: Left Arm, Patient Position: Sitting, Cuff Size: Normal)   Pulse 72   Ht 5' 2 (1.575 m) Comment: height measured without shoes  Wt 153 lb 6 oz (69.6 kg)   BMI 28.05 kg/m   Constitutional:   Pleasant elderly Caucasian female appears to be in NAD, Well developed, Well nourished, alert and cooperative Respiratory: Respirations even and unlabored. Lungs clear to auscultation bilaterally.   No wheezes, crackles,  or rhonchi.  Cardiovascular: Normal S1, S2. No MRG. Regular rate and rhythm. No peripheral edema, cyanosis or pallor.  Gastrointestinal:  Soft, nondistended, nontender. No rebound or guarding. Normal bowel sounds. No appreciable masses or hepatomegaly. Rectal: External: Grade 2 hemorrhoid, anal fissure slightly punctate and abnormal (exam confirmed with Dr. San at time of patient visit), tender to palpation; anoscopy deferred due to discomfort Psychiatric:  Demonstrates good judgement and reason without abnormal affect or behaviors.  No recent labs.  Assessment: 1.  Rectal pain with anal fissure: Confirmed with Dr. San at time of patient visit, ulcerated hemorrhoid with a visible fissure which is tender palpation, slightly abnormal looking 2.  Hemorrhoid banding: Last banding on 12/01/2023 scheduled for next banding on 12/23/2023  Plan: 1.  Patient tells me she has Nitroglycerin at home which she can start using 3 times daily for the next 6 to 8 weeks.  Also sent in Diltiazem for her to get the pharmacy if needed.  She can use this if she runs out. 2.  Recommend RectiCare lidocaine  as needed 3.  Recommend sitz bath's 15 to 20 minutes 2-3 times a day 4.  Recommend patient keep her stools soft and solid with a stool softener if needed. 5.  Will discuss with Dr. Nandigam about patient's next scheduled banding to delay this pending her symptoms and/or keep appointment for recheck of fissure.  Gloria Failing, PA-C [facility] 12/11/2023, 2:32 PM  Cc: Gloria Atkins ORN., PA-C

## 2023-12-11 NOTE — Patient Instructions (Signed)
 We have sent a prescription for Diltiazem 2% gel w/Lidocaine  to [facility] for you. Using your index finger, you should apply a small amount of medication inside the rectum up to your first knuckle/joint three times daily x 6-8 weeks.  [facility]'s information is below: Address: [address] [place], [place] [address]  [phone]  *Please DO NOT go directly from our office to pick up this medication! Give the pharmacy 1 day to process the prescription as this is compounded and takes time to make.  Keep follow up with Dr. Nandigam.   _______________________________________________________  If your blood pressure at your visit was 140/90 or greater, please contact your primary care physician to follow up on this.  _______________________________________________________  If you are age 8 or older, your body mass index should be between 23-30. Your Body mass index is 28.05 kg/m. If this is out of the aforementioned range listed, please consider follow up with your Primary Care Provider.  If you are age 59 or younger, your body mass index should be between 19-25. Your Body mass index is 28.05 kg/m. If this is out of the aformentioned range listed, please consider follow up with your Primary Care Provider.   ________________________________________________________  The [facility] providers would like to encourage you to use MYCHART to communicate with providers for non-urgent requests or questions.  Due to long hold times on the telephone, sending your provider a message by [facility] may be a faster and more efficient way to get a response.  Please allow 48 business hours for a response.  Please remember that this is for non-urgent requests.  _______________________________________________________  [facility] is using a team-based approach to care.  Your team is made up of your doctor and two to three APPS. Our APPS (Nurse Practitioners and Physician  Assistants) work with your physician to ensure care continuity for you. They are fully qualified to address your health concerns and develop a treatment plan. They communicate directly with your gastroenterologist to care for you. Seeing the Advanced Practice Practitioners on your physician's team can help you by facilitating care more promptly, often allowing for earlier appointments, access to diagnostic testing, procedures, and other specialty referrals.

## 2023-12-15 ENCOUNTER — Ambulatory Visit: Admitting: Gastroenterology

## 2023-12-15 ENCOUNTER — Telehealth: Payer: Self-pay

## 2023-12-15 ENCOUNTER — Telehealth: Payer: Self-pay | Admitting: *Deleted

## 2023-12-15 NOTE — Telephone Encounter (Signed)
===  View-only below this line=== ----- Message ----- From: Shila Gustav GAILS, MD Sent: 12/15/2023  12:10 PM EDT To: Grayce CHRISTELLA Loge, CMA; Delon Gibson Lemmo* Subject: RE: Next banding?                              Schedule hemorrhoid banding appointment in October once anal fissure heals.  Thank you ----- Message ----- From: Beather Delon Gibson, PA Sent: 12/11/2023   4:03 PM EDT To: Kavitha Nandigam V, MD Subject: Next banding?                                  Patient has banding with the schedule on 8/20, she did have a fissure today on exam, not sure if he wanted to move this appointment back or keep it, told her you would let her know.  Thanks, JL L

## 2023-12-15 NOTE — Telephone Encounter (Signed)
-----   Message from Delon Hendricks Failing sent at 12/15/2023 12:11 PM EDT ----- Regarding: FW: Next banding? ----- Message ----- From: Shila Gustav GAILS, MD Sent: 12/15/2023  12:10 PM EDT To: Grayce CHRISTELLA Loge, CMA; Delon Hendricks Lemmo# Subject: RE: Next banding?                              Schedule hemorrhoid banding appointment in October once anal fissure heals.  Thank you ----- Message ----- From: Failing Delon Hendricks, PA Sent: 12/11/2023   4:03 PM EDT To: Kavitha Nandigam V, MD Subject: Next banding?                                  Patient has banding with the schedule on 8/20, she did have a fissure today on exam, not sure if he wanted to move this appointment back or keep it, told her you would let her know.  Thanks, JL L

## 2023-12-15 NOTE — Telephone Encounter (Signed)
 The pt has already been set up for banding.

## 2023-12-15 NOTE — Telephone Encounter (Signed)
 Rescheduled patients hemorrhoidal banding for 02/10/2024 at 9:10 am

## 2023-12-23 ENCOUNTER — Encounter: Admitting: Gastroenterology

## 2024-02-10 ENCOUNTER — Encounter: Payer: Self-pay | Admitting: Gastroenterology

## 2024-02-10 ENCOUNTER — Ambulatory Visit (INDEPENDENT_AMBULATORY_CARE_PROVIDER_SITE_OTHER): Admitting: Gastroenterology

## 2024-02-10 VITALS — BP 134/70 | HR 67 | Ht 62.0 in | Wt 151.6 lb

## 2024-02-10 DIAGNOSIS — K641 Second degree hemorrhoids: Secondary | ICD-10-CM

## 2024-02-10 DIAGNOSIS — M6289 Other specified disorders of muscle: Secondary | ICD-10-CM

## 2024-02-10 NOTE — Patient Instructions (Signed)
 We will send a referral for you to Pelvic floor therapy, and they will contact you with an appointment  Take over the counter Colace 1 capsule at bedtime  Take Benefiber 1 tablespoon twice a day   Follow up in 3 months  HEMORRHOID BANDING PROCEDURE    FOLLOW-UP CARE   The procedure you have had should have been relatively painless since the banding of the area involved does not have nerve endings and there is no pain sensation.  The rubber band cuts off the blood supply to the hemorrhoid and the band may fall off as soon as 48 hours after the banding (the band may occasionally be seen in the toilet bowl following a bowel movement). You may notice a temporary feeling of fullness in the rectum which should respond adequately to plain Tylenol  or Motrin.  Following the banding, avoid strenuous exercise that evening and resume full activity the next day.  A sitz bath (soaking in a warm tub) or bidet is soothing, and can be useful for cleansing the area after bowel movements.     To avoid constipation, take two tablespoons of natural wheat bran, natural oat bran, flax, Benefiber or any over the counter fiber supplement and increase your water  intake to 7-8 glasses daily.    Unless you have been prescribed anorectal medication, do not put anything inside your rectum for two weeks: No suppositories, enemas, fingers, etc.  Occasionally, you may have more bleeding than usual after the banding procedure.  This is often from the untreated hemorrhoids rather than the treated one.  Don't be concerned if there is a tablespoon or so of blood.  If there is more blood than this, lie flat with your bottom higher than your head and apply an ice pack to the area. If the bleeding does not stop within a half an hour or if you feel faint, call our office at [phone] or go to the emergency room.  Problems are not common; however, if there is a substantial amount of bleeding, severe pain, chills, fever or  difficulty passing urine (very rare) or other problems, you should call us  at [phone] or report to the nearest emergency room.  Do not stay seated continuously for more than 2-3 hours for a day or two after the procedure.  Tighten your buttock muscles 10-15 times every two hours and take 10-15 deep breaths every 1-2 hours.  Do not spend more than a few minutes on the toilet if you cannot empty your bowel; instead re-visit the toilet at a later time.     I appreciate the  opportunity to care for you  Thank You   Kavitha Nandigam , MD

## 2024-02-10 NOTE — Progress Notes (Signed)
 PROCEDURE NOTE: The patient presents with symptomatic grade II  hemorrhoids, requesting rubber band ligation of his/her hemorrhoidal disease.  All risks, benefits and alternative forms of therapy were described and informed consent was obtained.   The anorectum was pre-medicated with 0.125% Nitroglycerine and recticare. The decision was made to band the right anterior internal hemorrhoid, and the [facility] System was used to perform band ligation without complication.  Digital anorectal examination was then performed to assure proper positioning of the band, and to adjust the banded tissue as required.  The patient was discharged home without pain or other issues.  Dietary and behavioral recommendations were given and along with follow-up instructions.     The following adjunctive treatments were recommended:  Benefiber 1 tablespoon 3 times daily with meals MiraLAX 1 capful at bedtime as needed to prevent constipation Refer to pelvic floor physical therapy for biofeedback for pelvic floor dysfunction  No complications were encountered and the patient tolerated the procedure well.   Return for office visit in 2 months  K. Veena Drexel Ivey , MD [phone]

## 2024-03-25 ENCOUNTER — Ambulatory Visit
Admission: EM | Admit: 2024-03-25 | Discharge: 2024-03-25 | Disposition: A | Discharge status: Home / Self Care | Attending: Nurse Practitioner | Admitting: Nurse Practitioner

## 2024-03-25 DIAGNOSIS — L539 Erythematous condition, unspecified: Secondary | ICD-10-CM

## 2024-03-25 DIAGNOSIS — N63 Unspecified lump in unspecified breast: Secondary | ICD-10-CM | POA: Diagnosis not present

## 2024-03-25 MED ORDER — DOXYCYCLINE HYCLATE 100 MG PO CAPS: 100.0000 mg | ORAL_CAPSULE | Freq: Two times a day (BID) | ORAL | 0 refills | Status: AC

## 2024-03-25 NOTE — ED Triage Notes (Signed)
 Pt have a raised area on the bottom of her right breast that is red and painful to the touch x 1 day. States it is hard

## 2024-03-25 NOTE — Discharge Instructions (Signed)
 We are treating you for an abscess in your right breast.  Take the doxycycline  twice daily for 7 days with food to treat for infection in your right breast.  Make sure you sit up 30 minutes after taking this medicine to prevent irritation in your esophagus.  If symptoms worsen despite treatment, please follow-up in the emergency room.  If symptoms do not fully improve, recommend follow-up with your PCP for further evaluation and management.

## 2024-03-25 NOTE — ED Notes (Signed)
 Pt is undressed from the waist down.

## 2024-03-25 NOTE — ED Provider Notes (Signed)
 [facility]    CSN: [id number] Arrival date & time: 03/25/24  1138      History   Chief Complaint No chief complaint on file.   HPI Gloria Atkins is a 78 y.o. female.   Patient presents today with 1 day history of right breast swelling, redness, and tenderness to touch.  She reports the area is under her right breast and is overlying the surgical scar where she had a breast reduction.  No fevers or nausea/vomiting. She is in the process of scheduling a screening mammogram.    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   stretching-improved swallowing since    Gallstones    GERD (gastroesophageal reflux disease)    Heart palpitations    Negative event monitor 4/12   Heartburn    Hyperlipidemia    Hyperplastic colon polyp    Hypothyroidism    thyroid removed   Nausea    Osteopenia    Osteoporosis     Patient Active Problem List   Diagnosis Date Noted   Prediabetes 11/09/2018   Vitamin B12 deficiency 11/09/2018   Right ovarian cyst 01/09/2016   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/03/2016   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   Worsening headaches 01/03/2016   History of ovarian cyst 12/13/2015   Osteopenia 10/04/2015   Knee pain 10/04/2015   Shortness of breath 10/04/2015   Diastolic dysfunction 10/04/2015   Cellulitis of breast 09/29/2012   Hypothyroidism 09/29/2012   Palpitations 08/08/2010   Chest pain 08/08/2010   GERD (gastroesophageal reflux disease) 03/15/2009   DYSPHAGIA UNSPECIFIED 03/15/2009   History of colonic polyps 03/15/2009   Dysphagia 03/15/2009   Diverticulosis of large intestine 03/15/2009   Irritable bowel syndrome 03/15/2009    Past  Surgical History:  Procedure Laterality Date   ABDOMINAL HYSTERECTOMY  1980   ANAL FISSURE REPAIR     BREAST REDUCTION SURGERY Bilateral 2016   CHOLECYSTECTOMY  1970's   COLONOSCOPY  05/2012   CYSTOSCOPY N/A 08/05/2012   Procedure: CYSTOSCOPY BLADDER BIOPSY ;  Surgeon: Norleen JINNY Seltzer, MD;  Location: [facility];  Service: Urology;  Laterality: N/A;   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/2012   Procedure: WITH FULGURATION ;  Surgeon: Norleen JINNY Seltzer, MD;  Location: [facility];  Service: Urology;  Laterality: N/A;   HEEL SPUR SURGERY Right    LIPOSUCTION     THYROIDECTOMY Bilateral 1974    OB History     Gravida  2   Para  2   Term  2   Preterm  0   AB  0   Living  2      SAB  0   IAB  0   Ectopic  0   Multiple  0   Live Births  2            Home Medications    Prior to Admission medications   Medication Sig Start Date End Date Taking? Authorizing Provider  doxycycline  (VIBRAMYCIN ) 100 MG capsule Take 1 capsule (100 mg total) by mouth 2 (two) times daily for 7 days. 03/25/24 04/01/24 Yes Chandra Harlene DELENA, NP  rosuvastatin (CRESTOR) 5 MG tablet Take 5 mg by mouth daily. 03/22/24 03/22/25 Yes [provider]  aspirin  EC 81 MG EC tablet Take 81 mg by mouth. OCCASIONALLY    [provider]  Calcium Carb-Cholecalciferol (CALCIUM 500 + D PO) Take by mouth.    [provider]  cholecalciferol (VITAMIN D3) 25 MCG (1000 UNIT) tablet Take by mouth. 06/11/18   [provider]  Cyanocobalamin 2000 MCG TBCR Take by mouth. 06/11/18   [provider]  docusate sodium  (COLACE) 100 MG capsule Take 200 mg by mouth daily.    [provider]  levothyroxine  (SYNTHROID , LEVOTHROID) 88 MCG tablet Take 88 mcg by mouth daily. Takes 88mcg every other day and 100mcg the off days    [provider]  montelukast (SINGULAIR) 10 MG tablet Take by mouth. 09/03/22   [provider]  Multiple  Vitamins-Minerals (MULTIVITAMIN ADULTS PO) Take 1 tablet by mouth daily. 10/19/12   [provider]  oxyCODONE  (OXY IR/ROXICODONE ) 5 MG immediate release tablet Take by mouth. 11/02/22   [provider]  pantoprazole  (PROTONIX ) 40 MG tablet 40 mg.    [provider]  PROLIA 60 MG/ML SOSY injection Inject into the skin. 03/04/22   [provider]    Family History Family History  Problem Relation Age of Onset   Gallbladder disease Mother    Osteoporosis Mother    Diverticulitis Mother    Coronary artery disease Father    Heart attack Father    Hypertension Father    Breast cancer Sister 77   Diverticulitis Sister    Diverticulitis Sister    Diverticulitis Sister    Alcoholism Brother    Colon cancer Neg Hx    Esophageal cancer Neg Hx    Liver disease Neg Hx     Social History Social History   Tobacco Use   Smoking status: Never   Smokeless tobacco: Never  Vaping Use   Vaping status: Never Used  Substance Use Topics   Alcohol use: Not Currently    Comment: occasional -once or twice a week   Drug use: No     Allergies   Fosamax [alendronate], Denosumab, Ibandronate, and Phentermine   Review of Systems Review of Systems Per HPI  Physical Exam Triage Vital Signs ED Triage Vitals  Encounter Vitals Group     BP 03/25/24 1152 138/82     Girls Systolic BP Percentile --      Girls Diastolic BP Percentile --      Boys Systolic BP Percentile --      Boys Diastolic BP Percentile --      Pulse Rate 03/25/24 1152 92     Resp 03/25/24 1152 18     Temp 03/25/24 1152 98.5 F (36.9 C)     Temp Source 03/25/24 1152 Oral     SpO2 03/25/24 1152 97 %     Weight --      Height --      Head Circumference --      Peak Flow --      Pain Score 03/25/24 1151 0     Pain Loc --      Pain Education --      Exclude from Growth Chart --    No data found.  Updated Vital Signs BP 138/82 (BP Location: Right Arm)   Pulse 92   Temp 98.5 F (36.9  C) (Oral)   Resp 18   SpO2 97%   Visual Acuity Right  Eye Distance:   Left Eye Distance:   Bilateral Distance:    Right Eye Near:   Left Eye Near:    Bilateral Near:     Physical Exam Vitals and nursing note reviewed. Chaperone present: patient declines chaperone today.  Constitutional:      General: She is not in acute distress.    Appearance: Normal appearance. She is not toxic-appearing.  HENT:     Head: Normocephalic and atraumatic.     Right Ear: External ear normal.     Left Ear: External ear normal.     Mouth/Throat:     Mouth: Mucous membranes are moist.     Pharynx: Oropharynx is clear. No oropharyngeal exudate or posterior oropharyngeal erythema.  Eyes:     General:        Right eye: No discharge.        Left eye: No discharge.     Extraocular Movements: Extraocular movements intact.  Cardiovascular:     Rate and Rhythm: Normal rate.  Pulmonary:     Effort: Pulmonary effort is normal. No respiratory distress.  Chest:  Breasts:    Right: Swelling and tenderness present. No bleeding, inverted nipple, mass, nipple discharge or skin change.       Comments: Mild erythema, swelling, and tenderness to touch under right breast over surgical scar.  No obvious mass or abscess appreciated.  Musculoskeletal:     Cervical back: Normal range of motion.  Lymphadenopathy:     Cervical: No cervical adenopathy.  Skin:    General: Skin is warm and dry.     Capillary Refill: Capillary refill takes less than 2 seconds.     Coloration: Skin is not jaundiced or pale.     Findings: No erythema.  Neurological:     Mental Status: She is alert and oriented to person, place, and time.  Psychiatric:        Behavior: Behavior is cooperative.      UC Treatments / Results  Labs (all labs ordered are listed, but only abnormal results are displayed) Labs Reviewed - No data to display  EKG   Radiology No results found.  Procedures Procedures (including critical care  time)  Medications Ordered in UC Medications - No data to display  Initial Impression / Assessment and Plan / UC Course  I have reviewed the triage vital signs and the nursing notes.  Pertinent labs & imaging results that were available during my care of the patient were reviewed by me and considered in my medical decision making (see chart for details).   Patient is well-appearing, normotensive, afebrile, not tachycardic, not tachypneic, oxygenating well on room air.   1. Swelling of breast 2. Erythema of breast Unclear etiology Will treat patient for abscess with doxycycline  twice daily for 7 days Other supportive measures discussed Recommended follow-up with primary care provider if symptoms do not fully resolve with treatment ER precautions discussed if symptoms worsen  The patient was given the opportunity to ask questions.  All questions answered to their satisfaction.  The patient is in agreement to this plan.   Final Clinical Impressions(s) / UC Diagnoses   Final diagnoses:  Swelling of breast  Erythema of breast     Discharge Instructions      We are treating you for an abscess in your right breast.  Take the doxycycline  twice daily for 7 days with food to treat for infection in your right breast.  Make sure you sit up 30 minutes after  taking this medicine to prevent irritation in your esophagus.  If symptoms worsen despite treatment, please follow-up in the emergency room.  If symptoms do not fully improve, recommend follow-up with your PCP for further evaluation and management.   ED Prescriptions     Medication Sig Dispense Auth. Provider   doxycycline  (VIBRAMYCIN ) 100 MG capsule Take 1 capsule (100 mg total) by mouth 2 (two) times daily for 7 days. 14 capsule Chandra Harlene LABOR, NP      I have reviewed the PDMP during this encounter.   Chandra Harlene LABOR, NP 03/25/24 [phone]

## 2024-04-05 NOTE — Therapy (Unsigned)
 OUTPATIENT PHYSICAL THERAPY FEMALE PELVIC EVALUATION   Patient Name: Gloria Atkins MRN: [id number] DOB:Jun 11, 1945, 78 y.o., female Today's Date: 04/06/2024  END OF SESSION:  PT End of Session - 04/06/24 0934     Visit Number 1    Date for Recertification  06/29/24    Authorization Type [employer]    Authorization - Visit Number 1    Authorization - Number of Visits 10    PT Start Time 0930    PT Stop Time 1010    PT Time Calculation (min) 40 min    Activity Tolerance Patient tolerated treatment well    Behavior During Therapy WFL for tasks assessed/performed          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   stretching-improved swallowing since    Gallstones    GERD (gastroesophageal reflux disease)    Heart palpitations    Negative event monitor 4/12   Heartburn    Hyperlipidemia    Hyperplastic colon polyp    Hypothyroidism    thyroid removed   Nausea    Osteopenia    Osteoporosis    Past Surgical History:  Procedure Laterality Date   ABDOMINAL HYSTERECTOMY  1980   ANAL FISSURE REPAIR     BREAST REDUCTION SURGERY Bilateral 2016   CHOLECYSTECTOMY  1970's   COLONOSCOPY  05/2012   CYSTOSCOPY N/A 08/05/2012   Procedure: CYSTOSCOPY BLADDER BIOPSY ;  Surgeon: Norleen JINNY Seltzer, MD;  Location: [facility];  Service: Urology;  Laterality: N/A;   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/2012   Procedure: WITH FULGURATION ;  Surgeon: Norleen JINNY Seltzer, MD;  Location: [facility];  Service: Urology;  Laterality: N/A;   HEEL SPUR SURGERY Right    LIPOSUCTION     THYROIDECTOMY Bilateral 1974   Patient Active Problem List   Diagnosis Date Noted   Prediabetes 11/09/2018   Vitamin B12 deficiency 11/09/2018   Right ovarian cyst  01/09/2016   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/03/2016   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   Worsening headaches 01/03/2016   History of ovarian cyst 12/13/2015   Osteopenia 10/04/2015   Knee pain 10/04/2015   Shortness of breath 10/04/2015   Diastolic dysfunction 10/04/2015   Cellulitis of breast 09/29/2012   Hypothyroidism 09/29/2012   Palpitations 08/08/2010   Chest pain 08/08/2010   GERD (gastroesophageal reflux disease) 03/15/2009   DYSPHAGIA UNSPECIFIED 03/15/2009   History of colonic polyps 03/15/2009   Dysphagia 03/15/2009   Diverticulosis of large intestine 03/15/2009   Irritable bowel syndrome 03/15/2009    PCP: Debrah Josette ORN, PA-C  REFERRING PROVIDER: Shila Gustav GAILS, MD   REFERRING DIAG:  M62.89 (ICD-10-CM) - Pelvic floor dysfunction      THERAPY DIAG:  Muscle weakness (generalized)  Other lack of coordination  Rationale for Evaluation and Treatment: Rehabilitation  ONSET DATE: 8/25  SUBJECTIVE:  SUBJECTIVE STATEMENT: Patient does get constipated some. She does not strain.  Benefiber 1 tablespoon 3 times daily with meals MiraLAX 1 capful at bedtime as needed to prevent constipation Refer to pelvic floor physical therapy for biofeedback for pelvic floor dysfunction Fluid intake:   FUNCTIONAL LIMITATIONS: pushing stools out  PERTINENT HISTORY:  Medications for current condition: none Surgeries: Abdominal hysterectomy; Anal fissure repair; Cholecystectomy; Fulguration of bladder tumor; Thyroidectomy Other: Diverticulosis; Hypothyroidism; Osteoporosis Sexual abuse: No  PAIN:  Are you having pain? No   PRECAUTIONS: Other: osteoporosis  RED FLAGS: None   WEIGHT BEARING  RESTRICTIONS: No  FALLS:  Has patient fallen in last 6 months? No  OCCUPATION: retired  ACTIVITY LEVEL : walking when warm  PLOF: Independent  PATIENT GOALS: be able to push stool out   BOWEL MOVEMENT: Pain with bowel movement: No Type of bowel movement:Type (Bristol Stool Scale) Type 2, Frequency every 3 days, Strain no, and Splinting presses between the vagina and rectum Fully empty rectum: No, due to reduced amount of stool coming out Leakage: No                                                  Bowel urgency: yes Pads: No Fiber supplement/laxative Benefiber, Miralax  URINATION: Pain with urination: No Fully empty bladder: Yes:                                           Post-void dribble: No Stream: average Leakage: none   INTERCOURSE:not active   PREGNANCY: Vaginal deliveries 2 Tearing Yes: litle  PROLAPSE: None   OBJECTIVE:  Note: Objective measures were completed at Evaluation unless otherwise noted.  COGNITION: Overall cognitive status: Within functional limits for tasks assessed     SENSATION: Light touch: Appears intact   FUNCTIONAL TESTS:  Single leg stance:  Mu:pwrmzjdzi trunk sway, 3 sec  Ou:pwrmzjdzi trunk sway, 3 sec  GAIT: Assistive device utilized: None  POSTURE: rounded shoulders and forward head   LUMBARAROM/PROM:full lumbar ROM   LOWER EXTREMITY ROM: full hip ROM   LOWER EXTREMITY MMT:  MMT Right eval Left eval  Hip extension 4/5 4/5  Hip abduction 3+/5 3/5   (Blank rows = not tested) PALPATION:  Pelvic Alignment: ASIS  Abdominal: tenderness located in the right lower quadrant and right upper quadrant  Diastasis: No Distortion: No  Breathing: decreased opening of the lower rib cage, unable to perform diaphragmatic breathing Scar tissue: Yes: gallbladder scar in upper right quadrant with restrictions               External Perineal Exam: increased folds and loose skin on the right side of the rectum                              Internal Pelvic Floor: decreased movement of the anococcygeal ligament; patient had difficulty with pushing therapist finger out while laying on her side  Patient confirms identification and approves PT to assess internal pelvic floor and treatment Yes All internal or external pelvic floor assessments and/or treatments are completed with proper hand hygiene and gloves hands. If needed gloves are changed with hand hygiene during patient care time. No emotional/communication barriers or  cognitive limitation. Patient is motivated to learn. Patient understands and agrees with treatment goals and plan. PT explains patient will be examined in standing, sitting, and lying down to see how their muscles and joints work. When they are ready, they will be asked to remove their underwear so PT can examine their perineum. The patient is also given the option of providing their own chaperone as one is not provided in our facility. The patient also has the right and is explained the right to defer or refuse any part of the evaluation or treatment including the internal exam. With the patient's consent, PT will use one gloved finger to gently assess the muscles of the pelvic floor, seeing how well it contracts and relaxes and if there is muscle symmetry. After, the patient will get dressed and PT and patient will discuss exam findings and plan of care. PT and patient discuss plan of care, schedule, attendance policy and HEP activities.   PELVIC MMT:   MMT eval  Internal Anal Sphincter 2/5  External Anal Sphincter 2/5  Puborectalis 1/5  (Blank rows = not tested)        TONE: low  PROLAPSE: none  TODAY'S TREATMENT:                                                                                                                              DATE: 04/06/24  EVAL Examination completed, findings reviewed, pt educated on POC, HEP, and female pelvic floor anatomy, reasoning with pelvic floor assessment  internally with pt consent, and abdominal massage. Pt motivated to participate in PT and agreeable to attempt recommendations.     PATIENT EDUCATION:  04/06/24 Education details: Access Code: [email or web address] Person educated: Patient Education method: Explanation, Demonstration, Tactile cues, Verbal cues, and Handouts Education comprehension: verbalized understanding, returned demonstration, verbal cues required, tactile cues required, and needs further education  HOME EXERCISE PROGRAM: 04/06/24 Access Code: [email or web address] URL: [email or web address] Date: 04/06/2024 Prepared by: Channing Pereyra  Exercises - Seated Pelvic Floor Contraction  - 3 x daily - 7 x weekly - 1 sets - 10 reps - 5 hold  Patient Education - Abdominal Massage for Constipation  ASSESSMENT:  CLINICAL IMPRESSION: Patient is a 78 y.o. female who was seen today for physical therapy evaluation and treatment for pelvic floor dysfunction. Patient has had hemorrhoid banding and history of anal fissure.  She will press between the vagina and rectum to assist with evacuation of stool. External and internal anal sphincter strength is 2/5 and puborectalis strength is 1/5. She has difficulty with pulling the puborectalis forward. She has difficulty with lifting the rectum. Patient had difficulty with pushing therapist finger out of the rectum. Patient will benefit from skilled therapy to improve strength and coordination of the rectum to reduce the risk of hemorrhoid.   OBJECTIVE IMPAIRMENTS: decreased coordination, decreased strength, and increased fascial restrictions.   ACTIVITY LIMITATIONS: toileting  PARTICIPATION LIMITATIONS: community activity  PERSONAL FACTORS: 1-2 comorbidities: Abdominal  hysterectomy; Anal fissure repair; Cholecystectomy; Fulguration of bladder tumor; Thyroidectomy are also affecting patient's functional outcome.   REHAB POTENTIAL: Excellent  CLINICAL DECISION MAKING: Evolving/moderate  complexity  EVALUATION COMPLEXITY: Moderate   GOALS: Goals reviewed with patient? Yes  SHORT TERM GOALS: Target date: 04/10/24  Patient able to perform diaphragmatic breathing with pelvic drop on the ball.  Baseline: Goal status: INITIAL  2.  Patient educated how to sit on the commode to push stool out.  Baseline:  Goal status: INITIAL   LONG TERM GOALS: Target date: 06/29/24  Patient independent with advanced HEP for core and pelvic floor strength.  Baseline:  Goal status: INITIAL  2.  Patient is able to push the therapist finger out of the rectum to make it easier to have a bowel movement.  Baseline:  Goal status: INITIAL  3.  Pelvic floor strength >/= 3/5 to coordinate the movement of the rectum to push stool out.  Baseline:  Goal status: INITIAL   PLAN:  PT FREQUENCY: 1x/week  PT DURATION: 12 weeks  PLANNED INTERVENTIONS: 97110-Therapeutic exercises, 97530- Therapeutic activity, 97112- Neuromuscular re-education, 97535- Self Care, [id number]- Manual therapy, Patient/Family education, Cryotherapy, Moist heat, and Biofeedback  PLAN FOR NEXT SESSION: engaging on lower abdomen strength, pelvic floor lift sitting on ball, ball squeeze,    Channing Pereyra, PT 04/06/24 1:16 PM

## 2024-04-06 ENCOUNTER — Encounter: Payer: Self-pay | Admitting: Physical Therapy

## 2024-04-06 ENCOUNTER — Other Ambulatory Visit: Payer: Self-pay

## 2024-04-06 ENCOUNTER — Ambulatory Visit: Attending: Gastroenterology | Admitting: Physical Therapy

## 2024-04-06 DIAGNOSIS — R278 Other lack of coordination: Secondary | ICD-10-CM | POA: Insufficient documentation

## 2024-04-06 DIAGNOSIS — M6281 Muscle weakness (generalized): Secondary | ICD-10-CM | POA: Diagnosis present

## 2024-04-13 ENCOUNTER — Ambulatory Visit
Admission: RE | Admit: 2024-04-13 | Discharge: 2024-04-13 | Disposition: A | Discharge status: Home / Self Care | Source: Ambulatory Visit | Attending: Family Medicine | Admitting: Family Medicine

## 2024-04-13 ENCOUNTER — Other Ambulatory Visit: Payer: Self-pay | Admitting: Family Medicine

## 2024-04-13 DIAGNOSIS — Z1231 Encounter for screening mammogram for malignant neoplasm of breast: Secondary | ICD-10-CM

## 2024-04-20 ENCOUNTER — Encounter: Payer: Self-pay | Admitting: Physical Therapy

## 2024-04-20 ENCOUNTER — Ambulatory Visit: Admitting: Physical Therapy

## 2024-04-20 ENCOUNTER — Other Ambulatory Visit: Payer: Self-pay | Admitting: Family Medicine

## 2024-04-20 DIAGNOSIS — M6281 Muscle weakness (generalized): Secondary | ICD-10-CM

## 2024-04-20 DIAGNOSIS — R928 Other abnormal and inconclusive findings on diagnostic imaging of breast: Secondary | ICD-10-CM

## 2024-04-20 DIAGNOSIS — R278 Other lack of coordination: Secondary | ICD-10-CM

## 2024-04-20 NOTE — Therapy (Signed)
 OUTPATIENT PHYSICAL THERAPY FEMALE PELVIC TREATMENT   Patient Name: Gloria Atkins MRN: [id number] DOB:1945-05-21, 78 y.o., female Today's Date: 04/20/2024  END OF SESSION:  PT End of Session - 04/20/24 1222     Visit Number 2    Date for Recertification  06/29/24    Authorization Type [employer]    Authorization - Visit Number 2    Authorization - Number of Visits 10    PT Start Time 1218    PT Stop Time 1300    PT Time Calculation (min) 42 min    Activity Tolerance Patient tolerated treatment well    Behavior During Therapy WFL for tasks assessed/performed           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   stretching-improved swallowing since    Gallstones    GERD (gastroesophageal reflux disease)    Heart palpitations    Negative event monitor 4/12   Heartburn    Hyperlipidemia    Hyperplastic colon polyp    Hypothyroidism    thyroid removed   Nausea    Osteopenia    Osteoporosis    Past Surgical History:  Procedure Laterality Date   ABDOMINAL HYSTERECTOMY  1980   ANAL FISSURE REPAIR     BREAST REDUCTION SURGERY Bilateral 2016   CHOLECYSTECTOMY  1970's   COLONOSCOPY  05/2012   CYSTOSCOPY N/A 08/05/2012   Procedure: CYSTOSCOPY BLADDER BIOPSY ;  Surgeon: Norleen JINNY Seltzer, MD;  Location: [facility];  Service: Urology;  Laterality: N/A;   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/2012   Procedure: WITH FULGURATION ;  Surgeon: Norleen JINNY Seltzer, MD;  Location: [facility];  Service: Urology;  Laterality: N/A;   HEEL SPUR SURGERY Right    LIPOSUCTION     THYROIDECTOMY Bilateral 1974   Patient Active Problem List   Diagnosis Date Noted   Prediabetes 11/09/2018   Vitamin B12 deficiency 11/09/2018   Right ovarian  cyst 01/09/2016   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/03/2016   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   Worsening headaches 01/03/2016   History of ovarian cyst 12/13/2015   Osteopenia 10/04/2015   Knee pain 10/04/2015   Shortness of breath 10/04/2015   Diastolic dysfunction 10/04/2015   Cellulitis of breast 09/29/2012   Hypothyroidism 09/29/2012   Palpitations 08/08/2010   Chest pain 08/08/2010   GERD (gastroesophageal reflux disease) 03/15/2009   DYSPHAGIA UNSPECIFIED 03/15/2009   History of colonic polyps 03/15/2009   Dysphagia 03/15/2009   Diverticulosis of large intestine 03/15/2009   Irritable bowel syndrome 03/15/2009    PCP: Debrah Josette ORN, PA-C  REFERRING PROVIDER: Shila Gustav GAILS, MD   REFERRING DIAG:  M62.89 (ICD-10-CM) - Pelvic floor dysfunction      THERAPY DIAG:  Muscle weakness (generalized)  Other lack of coordination  Rationale for Evaluation and Treatment: Rehabilitation  ONSET DATE: 8/25  SUBJECTIVE:  SUBJECTIVE STATEMENT: Patient reports that she found out some things she did not knowlast visit, it helps her with her bowel movements. She learned how to massage her stomach. She does not go every day, but every other day and it is looser. Using Benefiber once/ day 1 packet. No Miralax.    eval Patient does get constipated some. She does not strain.  Benefiber 1 tablespoon 3 times daily with meals MiraLAX 1 capful at bedtime as needed to prevent constipation Refer to pelvic floor physical therapy for biofeedback for pelvic floor dysfunction Fluid intake:   FUNCTIONAL LIMITATIONS: pushing stools out  PERTINENT HISTORY:  Medications for current condition: none Surgeries: Abdominal  hysterectomy; Anal fissure repair; Cholecystectomy; Fulguration of bladder tumor; Thyroidectomy Other: Diverticulosis; Hypothyroidism; Osteoporosis Sexual abuse: No  PAIN:  Are you having pain? No   PRECAUTIONS: Other: osteoporosis  RED FLAGS: None   WEIGHT BEARING RESTRICTIONS: No  FALLS:  Has patient fallen in last 6 months? No  OCCUPATION: retired  ACTIVITY LEVEL : walking when warm  PLOF: Independent  PATIENT GOALS: be able to push stool out   BOWEL MOVEMENT: Pain with bowel movement: No Type of bowel movement:Type (Bristol Stool Scale) Type 2, Frequency every 3 days, Strain no, and Splinting presses between the vagina and rectum Fully empty rectum: No, due to reduced amount of stool coming out Leakage: No                                                  Bowel urgency: yes Pads: No Fiber supplement/laxative Benefiber, Miralax  URINATION: Pain with urination: No Fully empty bladder: Yes:                                           Post-void dribble: No Stream: average Leakage: none   INTERCOURSE:not active   PREGNANCY: Vaginal deliveries 2 Tearing Yes: litle  PROLAPSE: None   OBJECTIVE:  Note: Objective measures were completed at Evaluation unless otherwise noted.  COGNITION: Overall cognitive status: Within functional limits for tasks assessed     SENSATION: Light touch: Appears intact   FUNCTIONAL TESTS:  Single leg stance:  Mu:pwrmzjdzi trunk sway, 3 sec  Ou:pwrmzjdzi trunk sway, 3 sec  GAIT: Assistive device utilized: None  POSTURE: rounded shoulders and forward head   LUMBARAROM/PROM:full lumbar ROM   LOWER EXTREMITY ROM: full hip ROM   LOWER EXTREMITY MMT:  MMT Right eval Left eval  Hip extension 4/5 4/5  Hip abduction 3+/5 3/5   (Blank rows = not tested) PALPATION:  Pelvic Alignment: ASIS  Abdominal: tenderness located in the right lower quadrant and right upper quadrant  Diastasis: No Distortion: No  Breathing:  decreased opening of the lower rib cage, unable to perform diaphragmatic breathing Scar tissue: Yes: gallbladder scar in upper right quadrant with restrictions               External Perineal Exam: increased folds and loose skin on the right side of the rectum                             Internal Pelvic Floor: decreased movement of the anococcygeal ligament; patient had difficulty with pushing therapist finger out while  laying on her side  Patient confirms identification and approves PT to assess internal pelvic floor and treatment Yes All internal or external pelvic floor assessments and/or treatments are completed with proper hand hygiene and gloves hands. If needed gloves are changed with hand hygiene during patient care time. No emotional/communication barriers or cognitive limitation. Patient is motivated to learn. Patient understands and agrees with treatment goals and plan. PT explains patient will be examined in standing, sitting, and lying down to see how their muscles and joints work. When they are ready, they will be asked to remove their underwear so PT can examine their perineum. The patient is also given the option of providing their own chaperone as one is not provided in our facility. The patient also has the right and is explained the right to defer or refuse any part of the evaluation or treatment including the internal exam. With the patient's consent, PT will use one gloved finger to gently assess the muscles of the pelvic floor, seeing how well it contracts and relaxes and if there is muscle symmetry. After, the patient will get dressed and PT and patient will discuss exam findings and plan of care. PT and patient discuss plan of care, schedule, attendance policy and HEP activities.   PELVIC MMT:   MMT eval  Internal Anal Sphincter 2/5  External Anal Sphincter 2/5  Puborectalis 1/5  (Blank rows = not tested)        TONE: low  PROLAPSE: none  TODAY'S TREATMENT:                                                                                                                               DATE:  04/20/2024 Abdominal massage reviewed and corrected Supine ball press +exhale 15 reps +pelvic floor lift Hip adduction with ball +exhale + horizontal abduction with thera band +pelvic floor lift 15 reps Seated ball press into thigh + exhale + pelvic floor lift 20 reps Leg press 30 reps #50 + exhale Cable rowing 2 plates +exhale 15 reps bilateral Chest press 2 plates + exhale + pelvic floor lift Educated patient on how to use green thera band on door handle for rowing exercise with soft knees and exhale and green thera band issued for HEP  04/06/24  EVAL Examination completed, findings reviewed, pt educated on POC, HEP, and female pelvic floor anatomy, reasoning with pelvic floor assessment internally with pt consent, and abdominal massage. Pt motivated to participate in PT and agreeable to attempt recommendations.     PATIENT EDUCATION:  04/06/24 Education details: Access Code: [email or web address] Person educated: Patient Education method: Explanation, Demonstration, Tactile cues, Verbal cues, and Handouts Education comprehension: verbalized understanding, returned demonstration, verbal cues required, tactile cues required, and needs further education  HOME EXERCISE PROGRAM: 04/06/24 Access Code: [email or web address] URL: [email or web address] Date: 04/06/2024 Prepared by: Channing Pereyra  Exercises - Seated Pelvic Floor Contraction  - 3 x daily - 7 x weekly - 1 sets -  10 reps - 5 hold  Patient Education - Abdominal Massage for Constipation  ASSESSMENT:  CLINICAL IMPRESSION: Patient was seen today for treatment of constipation. Patient with improving bowel regularity . Patient did well with exercises, manual therapy and education today. We discussed progress, abdominal massage and recommended using squatty potty. Patient using Benefiber, no Miralax now. She used to go to  the gym and stated that she should have never stopped, she did not have anybody to go with. Enjoyed core exercises in the gym. Treatment session focused on exercise to improve core strength and coordination. Patient had some difficulty with breath and needed VC's . Patient is progressing well towards goals and will benefit from continued PT to address deficits, reduce constipation and improve quality of life.       eval Patient is a 78 y.o. female who was seen today for physical therapy evaluation and treatment for pelvic floor dysfunction. Patient has had hemorrhoid banding and history of anal fissure.  She will press between the vagina and rectum to assist with evacuation of stool. External and internal anal sphincter strength is 2/5 and puborectalis strength is 1/5. She has difficulty with pulling the puborectalis forward. She has difficulty with lifting the rectum. Patient had difficulty with pushing therapist finger out of the rectum. Patient will benefit from skilled therapy to improve strength and coordination of the rectum to reduce the risk of hemorrhoid.   OBJECTIVE IMPAIRMENTS: decreased coordination, decreased strength, and increased fascial restrictions.   ACTIVITY LIMITATIONS: toileting  PARTICIPATION LIMITATIONS: community activity  PERSONAL FACTORS: 1-2 comorbidities: Abdominal hysterectomy; Anal fissure repair; Cholecystectomy; Fulguration of bladder tumor; Thyroidectomy are also affecting patient's functional outcome.   REHAB POTENTIAL: Excellent  CLINICAL DECISION MAKING: Evolving/moderate complexity  EVALUATION COMPLEXITY: Moderate   GOALS: Goals reviewed with patient? Yes  SHORT TERM GOALS: Target date: 04/10/24  Patient able to perform diaphragmatic breathing with pelvic drop on the ball.  Baseline: Goal status: INITIAL  2.  Patient educated how to sit on the commode to push stool out.  Baseline:  Goal status: met    LONG TERM GOALS: Target date:  06/29/24  Patient independent with advanced HEP for core and pelvic floor strength.  Baseline:  Goal status: INITIAL  2.  Patient is able to push the therapist finger out of the rectum to make it easier to have a bowel movement.  Baseline:  Goal status: INITIAL  3.  Pelvic floor strength >/= 3/5 to coordinate the movement of the rectum to push stool out.  Baseline:  Goal status: INITIAL   PLAN:  PT FREQUENCY: 1x/week  PT DURATION: 12 weeks  PLANNED INTERVENTIONS: 97110-Therapeutic exercises, 97530- Therapeutic activity, 97112- Neuromuscular re-education, 97535- Self Care, [id number]- Manual therapy, Patient/Family education, Cryotherapy, Moist heat, and Biofeedback  PLAN FOR NEXT SESSION: engaging on lower abdomen strength, pelvic floor lift sitting on ball, ball squeeze,    Sharday Michl, PT 04/20/2024 12:24 PM

## 2024-04-25 ENCOUNTER — Ambulatory Visit: Admitting: Physical Therapy

## 2024-04-25 DIAGNOSIS — M6281 Muscle weakness (generalized): Secondary | ICD-10-CM | POA: Diagnosis not present

## 2024-04-25 DIAGNOSIS — R278 Other lack of coordination: Secondary | ICD-10-CM

## 2024-04-25 NOTE — Therapy (Signed)
 " OUTPATIENT PHYSICAL THERAPY FEMALE PELVIC TREATMENT   Patient Name: Gloria Atkins MRN: [id number] DOB:[birth year], 78 y.o., female Today's Date: 04/25/2024  END OF SESSION:  PT End of Session - 04/25/24 1523     Visit Number 3    Date for Recertification  06/29/24    Authorization Type [employer]    Authorization - Visit Number 3    Authorization - Number of Visits 10    PT Start Time 0245    PT Stop Time 0325    PT Time Calculation (min) 40 min    Activity Tolerance Patient tolerated treatment well    Behavior During Therapy WFL for tasks assessed/performed            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   stretching-improved swallowing since    Gallstones    GERD (gastroesophageal reflux disease)    Heart palpitations    Negative event monitor 4/12   Heartburn    Hyperlipidemia    Hyperplastic colon polyp    Hypothyroidism    thyroid removed   Nausea    Osteopenia    Osteoporosis    Past Surgical History:  Procedure Laterality Date   ABDOMINAL HYSTERECTOMY  1980   ANAL FISSURE REPAIR     BREAST REDUCTION SURGERY Bilateral 2016   CHOLECYSTECTOMY  1970's   COLONOSCOPY  05/2012   CYSTOSCOPY N/A 08/05/2012   Procedure: CYSTOSCOPY BLADDER BIOPSY ;  Surgeon: Norleen JINNY Seltzer, MD;  Location: [facility];  Service: Urology;  Laterality: N/A;   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/2012   Procedure: WITH FULGURATION ;  Surgeon: Norleen JINNY Seltzer, MD;  Location: [facility];  Service: Urology;  Laterality: N/A;   HEEL SPUR SURGERY Right    LIPOSUCTION     THYROIDECTOMY Bilateral 1974   Patient Active Problem List   Diagnosis Date Noted   Prediabetes 11/09/2018   Vitamin B12 deficiency 11/09/2018   Right ovarian  cyst 01/09/2016   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/03/2016   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   Worsening headaches 01/03/2016   History of ovarian cyst 12/13/2015   Osteopenia 10/04/2015   Knee pain 10/04/2015   Shortness of breath 10/04/2015   Diastolic dysfunction 10/04/2015   Cellulitis of breast 09/29/2012   Hypothyroidism 09/29/2012   Palpitations 08/08/2010   Chest pain 08/08/2010   GERD (gastroesophageal reflux disease) 03/15/2009   DYSPHAGIA UNSPECIFIED 03/15/2009   History of colonic polyps 03/15/2009   Dysphagia 03/15/2009   Diverticulosis of large intestine 03/15/2009   Irritable bowel syndrome 03/15/2009    PCP: Debrah Josette ORN, PA-C  REFERRING PROVIDER: Shila Gustav GAILS, MD   REFERRING DIAG:  M62.89 (ICD-10-CM) - Pelvic floor dysfunction      THERAPY DIAG:  Muscle weakness (generalized)  Other lack of coordination  Rationale for Evaluation and Treatment: Rehabilitation  ONSET DATE: 8/25  SUBJECTIVE:  SUBJECTIVE STATEMENT: Patient reports that she is having a bowel movement every day now. She is still taking benefiber 1x/day. No soreness after last visit. She had her eyes dilated earlier and she doesn't have her glasses, so she has a headache.  eval Patient does get constipated some. She does not strain.  Benefiber 1 tablespoon 3 times daily with meals MiraLAX 1 capful at bedtime as needed to prevent constipation Refer to pelvic floor physical therapy for biofeedback for pelvic floor dysfunction Fluid intake:   FUNCTIONAL LIMITATIONS: pushing stools out  PERTINENT HISTORY:  Medications for current condition: none Surgeries: Abdominal hysterectomy; Anal fissure repair;  Cholecystectomy; Fulguration of bladder tumor; Thyroidectomy Other: Diverticulosis; Hypothyroidism; Osteoporosis Sexual abuse: No  PAIN:  Are you having pain? No   PRECAUTIONS: Other: osteoporosis  RED FLAGS: None   WEIGHT BEARING RESTRICTIONS: No  FALLS:  Has patient fallen in last 6 months? No  OCCUPATION: retired  ACTIVITY LEVEL : walking when warm  PLOF: Independent  PATIENT GOALS: be able to push stool out   BOWEL MOVEMENT: Pain with bowel movement: No Type of bowel movement:Type (Bristol Stool Scale) Type 2, Frequency every 3 days, Strain no, and Splinting presses between the vagina and rectum Fully empty rectum: No, due to reduced amount of stool coming out Leakage: No                                                  Bowel urgency: yes Pads: No Fiber supplement/laxative Benefiber, Miralax  URINATION: Pain with urination: No Fully empty bladder: Yes:                                           Post-void dribble: No Stream: average Leakage: none   INTERCOURSE:not active   PREGNANCY: Vaginal deliveries 2 Tearing Yes: litle  PROLAPSE: None   OBJECTIVE:  Note: Objective measures were completed at Evaluation unless otherwise noted.  COGNITION: Overall cognitive status: Within functional limits for tasks assessed     SENSATION: Light touch: Appears intact   FUNCTIONAL TESTS:  Single leg stance:  Mu:pwrmzjdzi trunk sway, 3 sec  Ou:pwrmzjdzi trunk sway, 3 sec  GAIT: Assistive device utilized: None  POSTURE: rounded shoulders and forward head   LUMBARAROM/PROM:full lumbar ROM   LOWER EXTREMITY ROM: full hip ROM   LOWER EXTREMITY MMT:  MMT Right eval Left eval  Hip extension 4/5 4/5  Hip abduction 3+/5 3/5   (Blank rows = not tested) PALPATION:  Pelvic Alignment: ASIS  Abdominal: tenderness located in the right lower quadrant and right upper quadrant  Diastasis: No Distortion: No  Breathing: decreased opening of the lower rib  cage, unable to perform diaphragmatic breathing Scar tissue: Yes: gallbladder scar in upper right quadrant with restrictions               External Perineal Exam: increased folds and loose skin on the right side of the rectum                             Internal Pelvic Floor: decreased movement of the anococcygeal ligament; patient had difficulty with pushing therapist finger out while laying on her side  Patient confirms identification and approves PT  to assess internal pelvic floor and treatment Yes All internal or external pelvic floor assessments and/or treatments are completed with proper hand hygiene and gloves hands. If needed gloves are changed with hand hygiene during patient care time. No emotional/communication barriers or cognitive limitation. Patient is motivated to learn. Patient understands and agrees with treatment goals and plan. PT explains patient will be examined in standing, sitting, and lying down to see how their muscles and joints work. When they are ready, they will be asked to remove their underwear so PT can examine their perineum. The patient is also given the option of providing their own chaperone as one is not provided in our facility. The patient also has the right and is explained the right to defer or refuse any part of the evaluation or treatment including the internal exam. With the patient's consent, PT will use one gloved finger to gently assess the muscles of the pelvic floor, seeing how well it contracts and relaxes and if there is muscle symmetry. After, the patient will get dressed and PT and patient will discuss exam findings and plan of care. PT and patient discuss plan of care, schedule, attendance policy and HEP activities.   PELVIC MMT:   MMT eval  Internal Anal Sphincter 2/5  External Anal Sphincter 2/5  Puborectalis 1/5  (Blank rows = not tested)        TONE: low  PROLAPSE: none  TODAY'S TREATMENT:                                                                                                                               DATE:  04/25/24: Supine diaphragmatic breathing + pelvic floor contraction x48min  Seated adductor ball squeeze + pelvic floor contraction + diaphragmatic breathing x:30 Seated hip abduction (GTB) + diaphragmatic breathing 2x10 Seated march (GTB) + diaphragmatic breathing 2x10  Seated hip internal rotation against ball + diaphragmatic breathing 2x10 each side  Supine bridge + adductor ball squeeze + diaphragmatic breathing x10   04/20/2024 Abdominal massage reviewed and corrected Supine ball press +exhale 15 reps +pelvic floor lift Hip adduction with ball +exhale + horizontal abduction with thera band +pelvic floor lift 15 reps Seated ball press into thigh + exhale + pelvic floor lift 20 reps Leg press 30 reps #50 + exhale Cable rowing 2 plates +exhale 15 reps bilateral Chest press 2 plates + exhale + pelvic floor lift Educated patient on how to use green thera band on door handle for rowing exercise with soft knees and exhale and green thera band issued for HEP  04/06/24  EVAL Examination completed, findings reviewed, pt educated on POC, HEP, and female pelvic floor anatomy, reasoning with pelvic floor assessment internally with pt consent, and abdominal massage. Pt motivated to participate in PT and agreeable to attempt recommendations.   PATIENT EDUCATION:  04/06/24 Education details: Access Code: [email or web address] Person educated: Patient Education method: Explanation, Demonstration, Tactile cues, Verbal cues, and Handouts Education  comprehension: verbalized understanding, returned demonstration, verbal cues required, tactile cues required, and needs further education  HOME EXERCISE PROGRAM: Access Code: [email or web address] URL: [email or web address] Date: 04/25/2024 Prepared by: Celena Domino  Exercises - Seated Pelvic Floor Contraction  - 3 x daily - 7 x weekly - 1 sets - 10 reps - 5 hold - Seated Pelvic  Floor Contraction with Isometric Hip Adduction  - 1 x daily - 7 x weekly - 2 sets - 10 reps - Seated Hip Abduction with Resistance  - 1 x daily - 7 x weekly - 2 sets - 10 reps - Seated March with Resistance  - 1 x daily - 7 x weekly - 2 sets - 10 reps - Seated Hip Internal Rotation with Ball and Resistance  - 1 x daily - 7 x weekly - 2 sets - 10 reps - Seated Abdominal Press into [employer]  - 1 x daily - 7 x weekly - 2 sets - 10 reps - Horizontal abd with TB with TRA breath  - 1 x daily - 7 x weekly - 2 sets - 10 reps - Supine Bridge with Resistance Band  - 1 x daily - 7 x weekly - 2 sets - 10 reps  Patient Education - Abdominal Massage for Constipation  ASSESSMENT:  CLINICAL IMPRESSION: Patient was seen today for treatment of constipation. Patient presents today with improved bowel frequency, now going daily with ease. She tolerated all exercise progressions in treatment room today as she had her eyes dilated before this visit and has a headache. Minimal cueing required by PT today, and pt was able to tolerate medium level resistance band with little to no difficulty. Patient is progressing well towards goals and will benefit from continued PT to address deficits, reduce constipation and improve quality of life.     eval Patient is a 78 y.o. female who was seen today for physical therapy evaluation and treatment for pelvic floor dysfunction. Patient has had hemorrhoid banding and history of anal fissure.  She will press between the vagina and rectum to assist with evacuation of stool. External and internal anal sphincter strength is 2/5 and puborectalis strength is 1/5. She has difficulty with pulling the puborectalis forward. She has difficulty with lifting the rectum. Patient had difficulty with pushing therapist finger out of the rectum. Patient will benefit from skilled therapy to improve strength and coordination of the rectum to reduce the risk of hemorrhoid.   OBJECTIVE IMPAIRMENTS:  decreased coordination, decreased strength, and increased fascial restrictions.   ACTIVITY LIMITATIONS: toileting  PARTICIPATION LIMITATIONS: community activity  PERSONAL FACTORS: 1-2 comorbidities: Abdominal hysterectomy; Anal fissure repair; Cholecystectomy; Fulguration of bladder tumor; Thyroidectomy are also affecting patient's functional outcome.   REHAB POTENTIAL: Excellent  CLINICAL DECISION MAKING: Evolving/moderate complexity  EVALUATION COMPLEXITY: Moderate   GOALS: Goals reviewed with patient? Yes  SHORT TERM GOALS: Target date: 04/10/24  Patient able to perform diaphragmatic breathing with pelvic drop on the ball.  Baseline: Goal status: INITIAL  2.  Patient educated how to sit on the commode to push stool out.  Baseline:  Goal status: met    LONG TERM GOALS: Target date: 06/29/24  Patient independent with advanced HEP for core and pelvic floor strength.  Baseline:  Goal status: INITIAL  2.  Patient is able to push the therapist finger out of the rectum to make it easier to have a bowel movement.  Baseline:  Goal status: INITIAL  3.  Pelvic floor strength >/= 3/5 to coordinate the  movement of the rectum to push stool out.  Baseline:  Goal status: INITIAL   PLAN:  PT FREQUENCY: 1x/week  PT DURATION: 12 weeks  PLANNED INTERVENTIONS: 97110-Therapeutic exercises, 97530- Therapeutic activity, 97112- Neuromuscular re-education, 97535- Self Care, [id number]- Manual therapy, Patient/Family education, Cryotherapy, Moist heat, and Biofeedback  PLAN FOR NEXT SESSION: progress pelvic floor training with movement, continue hip/glute/core strengthening   Celena Domino, PT, DPT 04/25/2024 3:25 PM [facility] [address] [place], [place] [address] Phone # [phone] Fax [phone] "

## 2024-05-02 ENCOUNTER — Ambulatory Visit: Admitting: Physical Therapy

## 2024-05-06 ENCOUNTER — Ambulatory Visit
Admission: RE | Admit: 2024-05-06 | Discharge: 2024-05-06 | Disposition: A | Discharge status: Home / Self Care | Source: Ambulatory Visit | Attending: Family Medicine | Admitting: Family Medicine

## 2024-05-06 DIAGNOSIS — R928 Other abnormal and inconclusive findings on diagnostic imaging of breast: Secondary | ICD-10-CM

## 2024-05-09 ENCOUNTER — Ambulatory Visit: Admitting: Physical Therapy

## 2024-05-17 ENCOUNTER — Ambulatory Visit: Admitting: Physical Therapy

## 2024-06-01 ENCOUNTER — Ambulatory Visit: Admitting: Gastroenterology

## 2024-06-22 ENCOUNTER — Ambulatory Visit: Admitting: Gastroenterology
